# Patient Record
Sex: Female | Born: 1995 | Hispanic: Yes | Marital: Single | Smoking: Never smoker
Health system: Southern US, Community
[De-identification: ages and names within clinical notes are randomized; demographics above are authoritative.]

## PROBLEM LIST (undated history)

## (undated) DIAGNOSIS — Z789 Other specified health status: Secondary | ICD-10-CM

## (undated) DIAGNOSIS — O24419 Gestational diabetes mellitus in pregnancy, unspecified control: Secondary | ICD-10-CM

## (undated) DIAGNOSIS — H9191 Unspecified hearing loss, right ear: Secondary | ICD-10-CM

## (undated) HISTORY — PX: NO PAST SURGERIES: SHX2092

## (undated) HISTORY — DX: Unspecified hearing loss, right ear: H91.91

---
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
## 2017-06-14 ENCOUNTER — Emergency Department: Payer: Self-pay

## 2017-06-14 ENCOUNTER — Emergency Department
Admission: EM | Admit: 2017-06-14 | Discharge: 2017-06-14 | Disposition: A | Discharge status: Home / Self Care | Payer: Self-pay | Attending: Emergency Medicine | Admitting: Emergency Medicine

## 2017-06-14 DIAGNOSIS — Y92410 Unspecified street and highway as the place of occurrence of the external cause: Secondary | ICD-10-CM | POA: Insufficient documentation

## 2017-06-14 DIAGNOSIS — S161XXA Strain of muscle, fascia and tendon at neck level, initial encounter: Secondary | ICD-10-CM | POA: Insufficient documentation

## 2017-06-14 DIAGNOSIS — S39012A Strain of muscle, fascia and tendon of lower back, initial encounter: Secondary | ICD-10-CM | POA: Insufficient documentation

## 2017-06-14 MED ORDER — CYCLOBENZAPRINE HCL 10 MG PO TABS
10.0000 mg | ORAL_TABLET | Freq: Three times a day (TID) | ORAL | 0 refills | Status: AC | PRN
Start: 2017-06-14 — End: 2017-07-05

## 2017-06-14 MED ORDER — KETOROLAC TROMETHAMINE 30 MG/ML IJ SOLN
30.0000 mg | Freq: Once | INTRAMUSCULAR | Status: AC
Start: 2017-06-14 — End: 2017-06-14
  Administered 2017-06-14: 30 mg via INTRAMUSCULAR
  Filled 2017-06-14: qty 1

## 2017-06-14 MED ORDER — IBUPROFEN 600 MG PO TABS
600.0000 mg | ORAL_TABLET | Freq: Four times a day (QID) | ORAL | 0 refills | Status: DC | PRN
Start: 2017-06-14 — End: 2020-02-16

## 2017-06-14 MED ORDER — CYCLOBENZAPRINE HCL 10 MG PO TABS
10.0000 mg | ORAL_TABLET | Freq: Once | ORAL | Status: AC
Start: 2017-06-14 — End: 2017-06-14
  Administered 2017-06-14: 10 mg via ORAL
  Filled 2017-06-14: qty 1
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
## 2017-06-14 NOTE — ED Notes (Signed)
Bed: N 38  Expected date:   Expected time:   Means of arrival:   Comments:  M 426
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
## 2018-01-07 ENCOUNTER — Ambulatory Visit: Payer: 59 | Attending: Emergency Medicine

## 2018-01-07 VITALS — BP 115/68 | HR 76 | Temp 97.7°F | Resp 16 | Ht 63.0 in | Wt 158.0 lb

## 2018-01-07 DIAGNOSIS — Z87828 Personal history of other (healed) physical injury and trauma: Secondary | ICD-10-CM | POA: Insufficient documentation
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
## 2020-02-16 ENCOUNTER — Emergency Department
Admission: EM | Admit: 2020-02-16 | Discharge: 2020-02-16 | Disposition: A | Discharge status: Home / Self Care | Payer: No Typology Code available for payment source | Attending: Emergency Medicine | Admitting: Emergency Medicine

## 2020-02-16 ENCOUNTER — Emergency Department: Payer: No Typology Code available for payment source

## 2020-02-16 DIAGNOSIS — R519 Headache, unspecified: Secondary | ICD-10-CM | POA: Insufficient documentation

## 2020-02-16 DIAGNOSIS — H9311 Tinnitus, right ear: Secondary | ICD-10-CM | POA: Insufficient documentation

## 2020-02-16 DIAGNOSIS — H938X1 Other specified disorders of right ear: Secondary | ICD-10-CM | POA: Insufficient documentation

## 2020-02-16 DIAGNOSIS — R42 Dizziness and giddiness: Secondary | ICD-10-CM | POA: Insufficient documentation

## 2020-02-16 DIAGNOSIS — H9201 Otalgia, right ear: Secondary | ICD-10-CM | POA: Insufficient documentation

## 2020-02-16 DIAGNOSIS — H9191 Unspecified hearing loss, right ear: Secondary | ICD-10-CM | POA: Insufficient documentation

## 2020-02-16 MED ORDER — IBUPROFEN 400 MG PO TABS
800.0000 mg | ORAL_TABLET | Freq: Once | ORAL | Status: AC
Start: 2020-02-16 — End: 2020-02-16
  Administered 2020-02-16: 800 mg via ORAL
  Filled 2020-02-16: qty 2

## 2020-02-16 MED ORDER — MECLIZINE HCL 25 MG PO TABS
25.0000 mg | ORAL_TABLET | Freq: Three times a day (TID) | ORAL | 0 refills | Status: AC | PRN
Start: 2020-02-16 — End: ?

## 2020-02-16 MED ORDER — MECLIZINE HCL 12.5 MG PO TABS
25.0000 mg | ORAL_TABLET | Freq: Once | ORAL | Status: AC
Start: 2020-02-16 — End: 2020-02-16
  Administered 2020-02-16: 25 mg via ORAL
  Filled 2020-02-16: qty 2

## 2020-02-16 MED ORDER — IBUPROFEN 600 MG PO TABS
600.0000 mg | ORAL_TABLET | Freq: Four times a day (QID) | ORAL | 0 refills | Status: AC | PRN
Start: 2020-02-16 — End: ?

## 2020-02-16 NOTE — ED Triage Notes (Signed)
Pt c/o 8/10 HA in temples with intermittent periods of lightheadedness/dizziness. Pt ambulatory and denies n/v/d, photophobia , SOB or chest pain.

## 2020-02-16 NOTE — ED Provider Notes (Signed)
[facility] EMERGENCY DEPARTMENT APP H&P         CLINICAL SUMMARY          Diagnosis:    .     Final diagnoses:   Acute nonintractable headache, unspecified headache type   Right ear pain   Dizziness         MDM Notes:       24 year old female with 3 days of right ear pain, fullness, decreased hearing, tinnitus, headache and dizziness.  In the ED she is very well-appearing with stable vitals.  No focal neuro deficits.  CT head is negative for acute process.  Symptoms possibly due to Mnire's disease, or other similar syndrome.  Advised outpatient follow-up with ENT.  Return to ED for any worsening symptoms or concerns.    Discussed w/ ED attending     Disposition:         Discharge           I was present for the bedside discharge alongside the patient's ED nurse. Course of visit in the ED and discharge instructions were reviewed with patient and they were given the opportunity to ask any questions regarding their care today. Patient and/ or patient's family verbalized understanding of, and comfort with, instructions and plan.          Discharge Prescriptions     Medication Sig Dispense Auth. Provider    ibuprofen (ADVIL) 600 MG tablet Take 1 tablet (600 mg total) by mouth every 6 (six) hours as needed for Pain or Fever 20 tablet Avarie Tavano, Ardeth Perfect, PA    meclizine (ANTIVERT) 25 MG tablet Take 1 tablet (25 mg total) by mouth 3 (three) times daily as needed for Dizziness 10 tablet Honor Junes, PA                     CLINICAL INFORMATION        HPI:      Chief Complaint: Headache and Otalgia  .      Context: home  Location: R ear, head  Duration: 3 days  Quality: aching  Timing: persistent  Maximum Severity: moderate  Modifying Factors: see additional history    Erica Sims is a 24 y.o. female  has no past medical history on file. who presents with 3 days of right ear pain associated with fullness, decreased hearing, ringing in her ear, headache and dizziness.  She saw her PCP at  onset of symptoms and was told her ear looked fine and discharged home.  She was advised to follow-up in the ED if her symptoms persisted.  She has had persistent symptoms over the last 3 days.  No nausea vomiting, loss of consciousness, fevers or chills.  No other symptoms or complaints.    History obtained from: Patient          ROS:      Review of Systems   Constitutional: Negative for chills and fever.   HENT: Positive for ear pain, hearing loss and tinnitus.    Eyes: Negative for blurred vision and double vision.   Respiratory: Negative for cough.    Cardiovascular: Negative for chest pain.   Gastrointestinal: Negative for nausea and vomiting.   Genitourinary: Negative for dysuria.   Musculoskeletal: Negative for myalgias.   Skin: Negative for rash.   Neurological: Positive for headaches.   Endo/Heme/Allergies: Does not bruise/bleed easily.   Psychiatric/Behavioral: Negative for depression.       All other systems reviewed and  negative except for what is documented above.      Physical Exam:      Pulse 99   BP 119/75   Resp 20   SpO2 99 %   Temp 97.9 F (36.6 C)    Physical Exam  Vitals and nursing note reviewed.   Constitutional:       General: She is not in acute distress.     Appearance: She is well-developed. She is not diaphoretic.   HENT:      Head: Normocephalic and atraumatic.      Right Ear: Tympanic membrane, ear canal and external ear normal.      Left Ear: Tympanic membrane, ear canal and external ear normal.   Eyes:      Conjunctiva/sclera: Conjunctivae normal.      Pupils: Pupils are equal, round, and reactive to light.   Cardiovascular:      Rate and Rhythm: Normal rate and regular rhythm.      Heart sounds: Normal heart sounds.   Pulmonary:      Effort: Pulmonary effort is normal. No respiratory distress.      Breath sounds: Normal breath sounds.   Abdominal:      General: Bowel sounds are normal.      Palpations: Abdomen is soft.      Tenderness: There is no abdominal tenderness.    Lymphadenopathy:      Cervical: No cervical adenopathy.   Skin:     General: Skin is warm and dry.   Neurological:      General: No focal deficit present.      Mental Status: She is alert and oriented to person, place, and time.                 PAST HISTORY        Primary Care Provider: Lorin Picket, MD        PMH/PSH:    .     History reviewed. No pertinent past medical history.    She has a past surgical history that includes No past surgeries.      Social/Family History:      She reports that she has never smoked. She has never used smokeless tobacco. She reports that she does not drink alcohol and does not use drugs.    No family history on file.      Listed Medications on Arrival:    .     Discharge Medication List as of 02/16/2020 12:53 PM         Allergies: She has No Known Allergies.            VISIT INFORMATION        Clinical Course in the ED:      Counseling: I have spoke with the patient/family and discussed today's findings, in addition to providing specific details for the plan of care. Questions are answered and there is agreement with the plan. Return precautions discussed.           Medications Given in the ED:    .     ED Medication Orders (From admission, onward)    Start Ordered     Status Ordering Provider    02/16/20 1145 02/16/20 1144  ibuprofen (ADVIL) tablet 800 mg  Once     Route: Oral  Ordered Dose: 800 mg     Last MAR action: Given Klara Stjames MICHELLE    02/16/20 1145 02/16/20 1144  meclizine (ANTIVERT) tablet 25 mg  Once  Route: Oral  Ordered Dose: 25 mg     Last MAR action: Given Jin Capote MICHELLE            Procedures:      Procedures      Interpretations:      Differential Diagnosis (not completely inclusive): High risk differentials considered, and include otitis externa, otitis media, bullous myringitis, mastoiditis, TM perforation    EKG:           Rhythm Strip Interpretation / Cardiac Monitor Analysis:           Pulse Ox Analysis: saturation: 99 %; Oxygen use:  room air; Interpretation: Normal      Critical Care Time:     Radiology -  interpreted by me with the following observations: CT head normal            RESULTS        Lab Results:      Results     ** No results found for the last 24 hours. **              Radiology Results:      CT Head without Contrast   Final Result    Normal examination.      Terrilee Croak, MD    02/16/2020 12:44 PM                  Scribe Attestation:                Honor Junes, PA  02/16/20 1335

## 2020-02-16 NOTE — Discharge Instructions (Signed)
Dear Ms. Erica Sims:    Thank you for choosing the [facility] Emergency Department, the premier emergency department in the [place] area.  I hope your visit today was EXCELLENT. You will receive a survey via text message that will give you the opportunity to provide feedback to your team about your visit. Please do not hesitate to reach out with any questions!    Specific instructions for your visit today:    FOLLOW UP WITH ENT  .    IF YOU DO NOT CONTINUE TO IMPROVE OR YOUR CONDITION WORSENS, PLEASE CONTACT YOUR DOCTOR OR RETURN IMMEDIATELY TO THE EMERGENCY DEPARTMENT.    Sincerely,  Shara Blazing, MD  Attending Emergency Physician  [facility] Emergency Department    ONSITE PHARMACY  Our full service onsite pharmacy is located in the ER waiting room.  Open 7 days a week from 9 am to 9 pm.  We accept all major insurances and prices are competitive with major retailers.  Ask your provider to print your prescriptions down to the pharmacy to speed you on your way home.    OBTAINING A PRIMARY CARE APPOINTMENT    Primary care physicians (PCPs, also known as primary care doctors) are either internists or family medicine doctors. Both types of PCPs focus on health promotion, disease prevention, patient education and counseling, and treatment of acute and chronic medical conditions.    If you need a primary care doctor, please call the below number and ask who is receiving new patients.     [facility]  Telephone:  [phone]  [email or web address]    DOCTOR REFERRALS  Call [phone] (available 24 hours a day, 7 days a week) if you need any further referrals and we can help you find a primary care doctor or specialist.  Also, available online at:  [email or web address]    YOUR CONTACT INFORMATION  Before leaving please check with registration to make sure we have an up-to-date contact number.  You can call registration at [phone] to update your information.  For  questions about your hospital bill, please call [phone].  For questions about your Emergency Dept Physician bill please call [phone].      FREE HEALTH SERVICES  If you need help with health or social services, please call 2-1-1 for a free referral to resources in your area.  2-1-1 is a free service connecting people with information on health insurance, free clinics, pregnancy, mental health, dental care, food assistance, housing, and substance abuse counseling.  Also, available online at:  http://[email or web address].org    MEDICAL RECORDS AND TESTS  Certain laboratory test results do not come back the same day, for example urine cultures.   We will contact you if other important findings are noted.  Radiology films are often reviewed again to ensure accuracy.  If there is any discrepancy, we will notify you.      Please call [phone] to pick up a complimentary CD of any radiology studies performed.  If you or your doctor would like to request a copy of your medical records, please call [phone].      ORTHOPEDIC INJURY   Please know that significant injuries can exist even when an initial x-ray is read as normal or negative.  This can occur because some fractures (broken bones) are not initially visible on x-rays.  For this reason, close outpatient follow-up with your primary care doctor or bone specialist (orthopedist) is required.    MEDICATIONS AND FOLLOWUP  Please be aware  that some prescription medications can cause drowsiness.  Use caution when driving or operating machinery.    The examination and treatment you have received in our Emergency Department is provided on an emergency basis, and is not intended to be a substitute for your primary care physician.  It is important that your doctor checks you again and that you report any new or remaining problems at that time.      [place]  The nearest 24 hour pharmacy is:    [facility] [place],  [place] [address]  [place]  [facility])  Call to start or finish an application, compare plans, enroll or ask a question.  [place]: [phone]  Web:  Healthcare.gov    Help Enrolling in [place]  ([phone])499-5374 (TOLL-FREE)  929 670 4512 (TTY)  Web:  Http://www.coverva.org    Local Help Enrolling in the [place]  385-333-4211 (MAIN)  Email:  health-help@nvfs$ .org  Web:  http://lewis-perez.info/  Address:  [id number] [place], [place] [address]    SEDATING MEDICATIONS  Sedating medications include strong pain medications (e.g. narcotics), muscle relaxers, benzodiazepines (used for anxiety and as muscle relaxers), Benadryl/diphenhydramine and other antihistamines for allergic reactions/itching, and other medications.  If you are unsure if you have received a sedating medication, please ask your physician or nurse.  If you received a sedating medication: DO NOT drive a car. DO NOT operate machinery. DO NOT perform jobs where you need to be alert.  DO NOT drink alcoholic beverages while taking this medicine.     If you get dizzy, sit or lie down at the first signs. Be careful going up and down stairs.  Be extra careful to prevent falls.     Never give this medicine to others.     Keep this medicine out of reach of children.     Do not take or save old medicines. Throw them away when outdated.     Keep all medicines in a cool, dry place. DO NOT keep them in your bathroom medicine cabinet or in a cabinet above the stove.    MEDICATION REFILLS  Please be aware that we cannot refill any prescriptions through the ER. If you need further treatment from what is provided at your ER visit, please follow up with your primary care doctor or your pain management specialist.    [place]  Did you know [facility] has two freestanding ERs located just a few miles away?   [place] [facility] [place] [facility] [place] have short wait times, easy free parking directly in front of the building and top patient satisfaction scores - and the same Board Certified Emergency Medicine doctors as [facility].

## 2020-02-16 NOTE — ED Provider Notes (Signed)
ATTENDING NOTE    I saw and evaluated the patient. Discussed with resident/ midlevel and agree with the resident's/ midlevel's findings and plan as documented in the resident's note.    CC: Erica Sims is a 24 y.o. female c/o 3 days of right ear pain with fullness ringing and dizziness..  Patient was advised to come to the.  Exam: As per PA  Lab: NA  Studies: CT of the head negative  Plan: CT negative discharge home follow-up with ENT  1. Acute nonintractable headache, unspecified headache type    2. Right ear pain    3. Dizziness            Petra Kuba, MD  02/16/20 1525

## 2020-06-19 NOTE — L&D Delivery Note (Signed)
OB/GYN Faculty Practice Delivery Note  Cheryl Whitney is a 25 y.o. G1P0 s/p VD at [redacted]w[redacted]d. She was admitted for IOL due to pre-eclampsia with severe features.   ROM: 27h 61m with clear fluid GBS Status:  Negative/-- (11/10 1005) Maximum Maternal Temperature: 103.67F right after delivery, afebrile prior to however with some suspicion that she may been developing Triple I. No antibiotics were given prior to delivery.  Labor Progress: Initial SVE: 0/thick/posterior. She had a protracted labor course that initially started on 11/14, she slowly progressed to complete with assistance of cytotec, FB placement, AROM, and pit. She had intermittently been on pit since 11/14.    Delivery Date/Time: 11/16 at 2116  Delivery: Called to room and patient was complete and pushing. Head delivered LOA. Loose nuchal cord present x1 and delivered through. Shoulder and body delivered in usual fashion. Infant initially stunned, placed on mother's abdomen, dried and stimulated. Cord clamped x 2 shortly after delivery by provider and given to awaiting NICU team for evaluation. Cord ABG collected, 7.146. Cord blood drawn. Cord blood drawn. Placenta delivered spontaneously with gentle cord traction. Brisk vaginal bleeding noted shortly after placental delivery, given TXA and Hemabate/lomotil in addition to pitocin started earlier with fundal massage. Manual lower uterine sweep performed with a boggy lower segment, moderate amount of clots retrieved. She had continued oozing, thus methergine IM was given. BP immediately prior to administration was 120/57. I&O performed with ~ 75cc clear urine. Bleeding significant improved with measures above and laceration repair as below, fundus remained firm. One additional lower sweep performed with minimal bleeding. Labia, perineum, vagina, and cervix inspected with a 1st degree perineal laceration that was repaired with 3.0 vicryl on CT.   Baby Weight: 3350g  Placenta: 3 vessel,  intact. Sent to pathology.  Complications: PPH  Lacerations: 1st degree perineal  EBL: 1200 mL Analgesia: Epidural   Infant:  APGAR (1 MIN): 6   APGAR (5 MINS): 8    Leticia Penna, DO  OB Family Medicine Fellow, [facility] for [employer], [facility] 05/04/2021, 10:14 PM

## 2020-10-02 ENCOUNTER — Inpatient Hospital Stay (HOSPITAL_COMMUNITY)
Admission: AD | Admit: 2020-10-02 | Discharge: 2020-10-02 | Disposition: A | Discharge status: Home / Self Care | Payer: Medicaid - Out of State | Attending: Obstetrics & Gynecology | Admitting: Obstetrics & Gynecology

## 2020-10-02 ENCOUNTER — Encounter (HOSPITAL_COMMUNITY): Payer: Self-pay | Admitting: Obstetrics & Gynecology

## 2020-10-02 ENCOUNTER — Other Ambulatory Visit: Payer: Self-pay

## 2020-10-02 ENCOUNTER — Inpatient Hospital Stay (HOSPITAL_COMMUNITY): Payer: Medicaid - Out of State

## 2020-10-02 DIAGNOSIS — Z3490 Encounter for supervision of normal pregnancy, unspecified, unspecified trimester: Secondary | ICD-10-CM

## 2020-10-02 DIAGNOSIS — O200 Threatened abortion: Secondary | ICD-10-CM | POA: Diagnosis not present

## 2020-10-02 DIAGNOSIS — Z3A01 Less than 8 weeks gestation of pregnancy: Secondary | ICD-10-CM | POA: Insufficient documentation

## 2020-10-02 DIAGNOSIS — O209 Hemorrhage in early pregnancy, unspecified: Secondary | ICD-10-CM

## 2020-10-02 DIAGNOSIS — O418X10 Other specified disorders of amniotic fluid and membranes, first trimester, not applicable or unspecified: Secondary | ICD-10-CM

## 2020-10-02 HISTORY — DX: Other specified health status: Z78.9

## 2020-10-02 LAB — CBC
HCT: 39 % (ref 36.0–46.0)
Hemoglobin: 13.1 g/dL (ref 12.0–15.0)
MCH: 30 pg (ref 26.0–34.0)
MCHC: 33.6 g/dL (ref 30.0–36.0)
MCV: 89.4 fL (ref 80.0–100.0)
Platelets: 250 10*3/uL (ref 150–400)
RBC: 4.36 MIL/uL (ref 3.87–5.11)
RDW: 12.5 % (ref 11.5–15.5)
WBC: 8.3 10*3/uL (ref 4.0–10.5)
nRBC: 0 % (ref 0.0–0.2)

## 2020-10-02 LAB — HCG, QUANTITATIVE, PREGNANCY: hCG, Beta Chain, Quant, S: 51289 m[IU]/mL — ABNORMAL HIGH (ref <5)

## 2020-10-02 LAB — POCT PREGNANCY, URINE: Preg Test, Ur: POSITIVE — AB

## 2020-10-02 LAB — URINALYSIS, ROUTINE W REFLEX MICROSCOPIC
Bilirubin Urine: NEGATIVE
Glucose, UA: NEGATIVE mg/dL
Ketones, ur: NEGATIVE mg/dL
Leukocytes,Ua: NEGATIVE
Nitrite: NEGATIVE
Protein, ur: 100 mg/dL — AB
Specific Gravity, Urine: 1.023 (ref 1.005–1.030)
pH: 6 (ref 5.0–8.0)

## 2020-10-02 LAB — OB RESULTS CONSOLE HIV ANTIBODY (ROUTINE TESTING): HIV: NONREACTIVE

## 2020-10-02 LAB — WET PREP, GENITAL
Sperm: NONE SEEN
Trich, Wet Prep: NONE SEEN
Yeast Wet Prep HPF POC: NONE SEEN

## 2020-10-02 LAB — ABO/RH: ABO/RH(D): O POS

## 2020-10-02 LAB — HIV ANTIBODY (ROUTINE TESTING W REFLEX): HIV Screen 4th Generation wRfx: NONREACTIVE

## 2020-10-02 NOTE — Discharge Instructions (Signed)
Return to MAU:  If you have heavier bleeding that soaks through more that 2 pads per hour for an hour or more  If you bleed so much that you feel like you might pass out or you do pass out  If you have significant abdominal pain that is not improved with Tylenol 1000 mg every 6 hours as needed for pain  If you develop a fever > 100.5   

## 2020-10-02 NOTE — MAU Note (Signed)
Pt reports to mau with c/o vag bleeding.  Pt states she noticed light spotting on Monday but woke up to heavier bright red bleeding today.  Pt states she is having to wear a pad but has not saturated one completely.  Pt denies any pain at this time.

## 2020-10-02 NOTE — MAU Provider Note (Signed)
History     CSN: [id number]  Arrival date and time: 10/02/20 0851   Event Date/Time   First Provider Initiated Contact with Patient 10/02/20 1000      Chief Complaint  Patient presents with  . Vaginal Bleeding   Ms. Cheryl Whitney is a 25 y.o. year old G1P0 female at [redacted]w[redacted]d weeks gestation who presents to MAU reporting she noticed light spotting Monday 09/27/2020. She reports she started having BRB this morning. She has been wearing a pad, but not saturating it. She denies any pain. Her boyfriend/FOB is present and contributing to the history taking.   OB History    Gravida  1   Para      Term      Preterm      AB      Living        SAB      IAB      Ectopic      Multiple      Live Births              Past Medical History:  Diagnosis Date  . Medical history non-contributory     Past Surgical History:  Procedure Laterality Date  . NO PAST SURGERIES      History reviewed. No pertinent family history.  Social History   Tobacco Use  . Smoking status: Never Smoker  . Smokeless tobacco: Never Used  Substance Use Topics  . Alcohol use: Not Currently  . Drug use: Never    Allergies: No Known Allergies  No medications prior to admission.    Review of Systems  Constitutional: Negative.   HENT: Negative.   Eyes: Negative.   Respiratory: Negative.   Cardiovascular: Negative.   Gastrointestinal: Negative.   Endocrine: Negative.   Genitourinary: Positive for vaginal bleeding (light spotting that increased to BRB). Negative for pelvic pain.  Musculoskeletal: Negative.   Skin: Negative.   Allergic/Immunologic: Negative.   Neurological: Negative.   Hematological: Negative.   Psychiatric/Behavioral: Negative.    Physical Exam   Blood pressure 116/71, pulse 80, temperature 97.9 F (36.6 C), temperature source Oral, resp. rate 16, height 5\' 3"  (1.6 m), last menstrual period 08/10/2020, SpO2 100 %.  Physical Exam Vitals and nursing  note reviewed. Exam conducted with a chaperone present.  Constitutional:      Appearance: Normal appearance. She is normal weight.  Cardiovascular:     Rate and Rhythm: Normal rate.     Pulses: Normal pulses.  Pulmonary:     Effort: Pulmonary effort is normal.  Abdominal:     Palpations: Abdomen is soft.  Genitourinary:    General: Normal vulva.     Comments: Pelvic exam: External genitalia normal, SE: vaginal walls pink and well rugated, cervix is smooth, pink, no lesions, scant amt of light red blood in vaginal vault -- WP, GC/CT done, cervix visually closed, Uterus is non-tender, no CMT or friability, no adnexal tenderness.  Musculoskeletal:        General: Normal range of motion.  Skin:    General: Skin is warm and dry.  Neurological:     Mental Status: She is alert and oriented to person, place, and time.  Psychiatric:        Mood and Affect: Mood normal.        Behavior: Behavior normal.        Thought Content: Thought content normal.        Judgment: Judgment normal.     MAU Course  Procedures  MDM CCUA UPT CBC ABO/Rh HCG Wet Prep GC/CT -- pending HIV -- pending OB < 14 wks [place] with TV  Results for orders placed or performed during the hospital encounter of 10/02/20 (from the past 24 hour(s))  Pregnancy, urine POC     Status: Abnormal   Collection Time: 10/02/20  8:59 AM  Result Value Ref Range   Preg Test, Ur POSITIVE (A) NEGATIVE  Urinalysis, Routine w reflex microscopic Urine, Clean Catch     Status: Abnormal   Collection Time: 10/02/20  8:59 AM  Result Value Ref Range   Color, Urine YELLOW YELLOW   APPearance HAZY (A) CLEAR   Specific Gravity, Urine 1.023 1.005 - 1.030   pH 6.0 5.0 - 8.0   Glucose, UA NEGATIVE NEGATIVE mg/dL   Hgb urine dipstick LARGE (A) NEGATIVE   Bilirubin Urine NEGATIVE NEGATIVE   Ketones, ur NEGATIVE NEGATIVE mg/dL   Protein, ur 841 (A) NEGATIVE mg/dL   Nitrite NEGATIVE NEGATIVE   Leukocytes,Ua NEGATIVE NEGATIVE   RBC / HPF  0-5 0 - 5 RBC/hpf   WBC, UA 6-10 0 - 5 WBC/hpf   Bacteria, UA RARE (A) NONE SEEN   Squamous Epithelial / LPF 11-20 0 - 5   Mucus PRESENT   HIV Antibody (routine testing w rflx)     Status: None   Collection Time: 10/02/20 10:02 AM  Result Value Ref Range   HIV Screen 4th Generation wRfx Non Reactive Non Reactive  CBC     Status: None   Collection Time: 10/02/20 10:02 AM  Result Value Ref Range   WBC 8.3 4.0 - 10.5 K/uL   RBC 4.36 3.87 - 5.11 MIL/uL   Hemoglobin 13.1 12.0 - 15.0 g/dL   HCT 66.0 63.0 - 16.0 %   MCV 89.4 80.0 - 100.0 fL   MCH 30.0 26.0 - 34.0 pg   MCHC 33.6 30.0 - 36.0 g/dL   RDW 10.9 32.3 - 55.7 %   Platelets 250 150 - 400 K/uL   nRBC 0.0 0.0 - 0.2 %  ABO/Rh     Status: None   Collection Time: 10/02/20 10:02 AM  Result Value Ref Range   ABO/RH(D) O POS    No rh immune globuloin      NOT A RH IMMUNE GLOBULIN CANDIDATE, PT RH POSITIVE Performed at [facility], [address], [place], [place] [address]   hCG, quantitative, pregnancy     Status: Abnormal   Collection Time: 10/02/20 10:02 AM  Result Value Ref Range   hCG, Beta Chain, Quant, S 51,289 (H) <5 mIU/mL  Wet prep, genital     Status: Abnormal   Collection Time: 10/02/20 10:09 AM  Result Value Ref Range   Yeast Wet Prep HPF POC NONE SEEN NONE SEEN   Trich, Wet Prep NONE SEEN NONE SEEN   Clue Cells Wet Prep HPF POC PRESENT (A) NONE SEEN   WBC, Wet Prep HPF POC FEW (A) NONE SEEN   Sperm NONE SEEN     US OB LESS THAN 14 WEEKS WITH OB TRANSVAGINAL  Result Date: 10/02/2020 CLINICAL DATA:  Pregnant, vaginal bleeding. EXAM: OBSTETRIC <14 WK [place] AND TRANSVAGINAL OB US DOPPLER ULTRASOUND OF OVARIES TECHNIQUE: Both transabdominal and transvaginal ultrasound examinations were performed for complete evaluation of the gestation as well as the maternal uterus, adnexal regions, and pelvic cul-de-sac. Transvaginal technique was performed to assess early pregnancy. Color and duplex Doppler ultrasound was  utilized to evaluate blood flow to the  ovaries. COMPARISON:  None. FINDINGS: Intrauterine gestational sac: Single Yolk sac:  Visualized. Embryo:  Visualized. Cardiac Activity: Visualized. Heart Rate: 130 bpm MSD:   mm    w     d CRL:   5.9 mm   6 w 2 d                  [place] [facility]: May 26, 2021 Subchorionic hemorrhage: Small subchorionic hemorrhage is identified. Maternal uterus/adnexae: The bilateral ovaries are normal. No free fluid is identified. IMPRESSION: Normal single intrauterine pregnancy measuring to 6 weeks and 2 days by crown-rump length. Small subchorionic hemorrhage is noted. Electronically Signed   By: Sherian Rein M.D.   On: 10/02/2020 11:14    Assessment and Plan  Subchorionic hematoma in first trimester, single or unspecified fetus - Information provided on [facility] - Explained that [facility] places her at higher risk for miscarriage - Return to MAU:  If you have heavier bleeding that soaks through more that 2 pads per hour for an hour or more  If you bleed so much that you feel like you might pass out or you do pass out  If you have significant abdominal pain that is not improved with Tylenol 1000 mg every 6 hours as needed for pain  If you develop a fever > 100.5    [redacted] weeks gestation of pregnancy  Threatened miscarriage in early pregnancy - Information provided on threatened miscarriage   - Discharge home - Start [facility] as soon as possible Patient verbalized an understanding of the plan of care and agrees.     Raelyn Mora, CNM 10/02/2020, 10:08 AM

## 2020-10-04 LAB — GC/CHLAMYDIA PROBE AMP (~~LOC~~) NOT AT ARMC
Chlamydia: NEGATIVE
Comment: NEGATIVE
Comment: NORMAL
Neisseria Gonorrhea: NEGATIVE

## 2020-10-25 ENCOUNTER — Other Ambulatory Visit: Payer: Self-pay | Admitting: Family

## 2020-10-25 DIAGNOSIS — Z3682 Encounter for antenatal screening for nuchal translucency: Secondary | ICD-10-CM

## 2020-10-25 DIAGNOSIS — Z3A13 13 weeks gestation of pregnancy: Secondary | ICD-10-CM

## 2020-10-25 LAB — OB RESULTS CONSOLE RUBELLA ANTIBODY, IGM
Rubella: IMMUNE
Rubella: IMMUNE

## 2020-10-25 LAB — OB RESULTS CONSOLE GC/CHLAMYDIA
Chlamydia: NEGATIVE
Gonorrhea: NEGATIVE

## 2020-10-25 LAB — HEPATITIS C ANTIBODY: HCV Ab: NEGATIVE

## 2020-10-25 LAB — OB RESULTS CONSOLE ABO/RH: RH Type: POSITIVE

## 2020-10-25 LAB — CYTOLOGY - PAP: Pap: NEGATIVE

## 2020-10-25 LAB — OB RESULTS CONSOLE HEPATITIS B SURFACE ANTIGEN: Hepatitis B Surface Ag: NEGATIVE

## 2020-10-25 LAB — OB RESULTS CONSOLE ANTIBODY SCREEN: Antibody Screen: NEGATIVE

## 2020-10-25 LAB — OB RESULTS CONSOLE VARICELLA ZOSTER ANTIBODY, IGG: Varicella: IMMUNE

## 2020-10-25 LAB — CYSTIC FIBROSIS DIAGNOSTIC STUDY: Interpretation-CFDNA:: NEGATIVE

## 2020-11-22 ENCOUNTER — Inpatient Hospital Stay (HOSPITAL_COMMUNITY)
Admission: AD | Admit: 2020-11-22 | Discharge: 2020-11-22 | Disposition: A | Discharge status: Home / Self Care | Payer: Medicaid - Out of State | Attending: Family Medicine | Admitting: Family Medicine

## 2020-11-22 ENCOUNTER — Other Ambulatory Visit: Payer: Self-pay

## 2020-11-22 ENCOUNTER — Encounter (HOSPITAL_COMMUNITY): Payer: Self-pay | Admitting: Family Medicine

## 2020-11-22 DIAGNOSIS — W010XXA Fall on same level from slipping, tripping and stumbling without subsequent striking against object, initial encounter: Secondary | ICD-10-CM | POA: Diagnosis not present

## 2020-11-22 DIAGNOSIS — S29011A Strain of muscle and tendon of front wall of thorax, initial encounter: Secondary | ICD-10-CM | POA: Diagnosis not present

## 2020-11-22 DIAGNOSIS — H905 Unspecified sensorineural hearing loss: Secondary | ICD-10-CM | POA: Insufficient documentation

## 2020-11-22 DIAGNOSIS — O9A211 Injury, poisoning and certain other consequences of external causes complicating pregnancy, first trimester: Secondary | ICD-10-CM | POA: Diagnosis not present

## 2020-11-22 DIAGNOSIS — O99891 Other specified diseases and conditions complicating pregnancy: Secondary | ICD-10-CM | POA: Diagnosis not present

## 2020-11-22 DIAGNOSIS — W01198A Fall on same level from slipping, tripping and stumbling with subsequent striking against other object, initial encounter: Secondary | ICD-10-CM | POA: Diagnosis not present

## 2020-11-22 DIAGNOSIS — O26891 Other specified pregnancy related conditions, first trimester: Secondary | ICD-10-CM | POA: Diagnosis not present

## 2020-11-22 DIAGNOSIS — Z3A13 13 weeks gestation of pregnancy: Secondary | ICD-10-CM | POA: Diagnosis not present

## 2020-11-22 DIAGNOSIS — M549 Dorsalgia, unspecified: Secondary | ICD-10-CM | POA: Diagnosis present

## 2020-11-22 DIAGNOSIS — Y9389 Activity, other specified: Secondary | ICD-10-CM | POA: Insufficient documentation

## 2020-11-22 DIAGNOSIS — Y9289 Other specified places as the place of occurrence of the external cause: Secondary | ICD-10-CM | POA: Diagnosis not present

## 2020-11-22 DIAGNOSIS — S29012A Strain of muscle and tendon of back wall of thorax, initial encounter: Secondary | ICD-10-CM | POA: Diagnosis not present

## 2020-11-22 LAB — URINALYSIS, ROUTINE W REFLEX MICROSCOPIC
Bacteria, UA: NONE SEEN
Bilirubin Urine: NEGATIVE
Glucose, UA: NEGATIVE mg/dL
Hgb urine dipstick: NEGATIVE
Ketones, ur: NEGATIVE mg/dL
Nitrite: NEGATIVE
Protein, ur: NEGATIVE mg/dL
Specific Gravity, Urine: 1.02 (ref 1.005–1.030)
pH: 5 (ref 5.0–8.0)

## 2020-11-22 MED ORDER — ACETAMINOPHEN 500 MG PO TABS: 1000.0000 mg | ORAL_TABLET | Freq: Three times a day (TID) | ORAL | 2 refills | Status: AC | PRN

## 2020-11-22 NOTE — MAU Note (Signed)
Pt reports working yesterday after falling and the discomfort in her back has increased.

## 2020-11-22 NOTE — MAU Note (Signed)
Presents c/o back pain after a fall that occurred yesterday.  States fell landing onto back.  Denies VB.  Hasn't taken any meds or used heat to manage pain.

## 2020-11-22 NOTE — Discharge Instructions (Signed)
Intercostal Strain  What are the causes? This condition is usually caused by a hard, direct hit to an area of the body. This often occurs while playing contact sports. What are the signs or symptoms? Symptoms of this condition include:  Swelling and redness of the injured area.  Discoloration of the injured area.  Tenderness and soreness of the injured area.  Pain with or without movement.  Pain when breathing in. How is this diagnosed? This condition may be diagnosed based on:  Your symptoms and medical history.  A physical exam.  How is this treated? This condition may be treated with:  Rest. This is often the best treatment for a rib contusion.  Ice packs. This reduces swelling and inflammation.  Deep-breathing exercises. These may be recommended to reduce the risk for lung collapse and pneumonia.  Medicines. Over-the-counter or prescription medicines may be given to control pain.  Injection of a numbing medicine around the nerve near your injury (nerve block). Follow these instructions at home: Medicines  Take over-the-counter and prescription medicines only as told by your health care provider.  Ask your health care provider if the medicine prescribed to you: ? Requires you to avoid driving or using machinery. ? Can cause constipation. You may need to take these actions to prevent or treat constipation:  Drink enough fluid to keep your urine pale yellow.  Take over-the-counter or prescription medicines.  Eat foods that are high in fiber, such as beans, whole grains, and fresh fruits and vegetables.  Limit foods that are high in fat and processed sugars, such as fried or sweet foods. Managing pain, stiffness, and swelling If directed, put ice on the injured area. To do this:  Put ice in a plastic bag.  Place a towel between your skin and the bag.  Leave the ice on for 20 minutes, 2-3 times a day.  Remove the ice if your skin turns bright red. This is  very important. If you cannot feel pain, heat, or cold, you have a greater risk of damage to the area.    Activity  Rest the injured area.  Avoid strenuous activity and any activities or movements that cause pain. Be careful during activities, and avoid bumping the injured area.  Do not lift anything that is heavier than 5 lb (2.3 kg), or the limit that you are told, until your health care provider says that it is safe. General instructions  Do not use any products that contain nicotine or tobacco, such as cigarettes, e-cigarettes, and chewing tobacco. These can delay healing. If you need help quitting, ask your health care provider.  Do deep-breathing exercises as told by your health care provider.  If you were given an incentive spirometer, use it every 1-2 hours while you are awake, or as recommended by your health care provider. This device measures how well you are filling your lungs with each breath.  Keep all follow-up visits. This is important.    Contact a health care provider if you have:  Increased bruising or swelling.  Pain that is not controlled with treatment.  A fever. Get help right away if you:  Have difficulty breathing or shortness of breath.  Develop a continual cough, or you cough up thick or bloody mucus from your lungs (sputum).  Feel nauseous or you vomit.  Have pain in your abdomen. These symptoms may represent a serious problem that is an emergency. Do not wait to see if the symptoms will go away. Get medical  help right away. Call your local emergency services (911 in the [place] Do not drive yourself to the hospital. Summary  Rest the injured area. Avoid strenuous activity and any activities or movements that cause pain. This information is not intended to replace advice given to you by your health care provider. Make sure you discuss any questions you have with your health care provider. Document Revised: 09/10/2019 Document Reviewed:  09/10/2019 Elsevier Patient Education  2021 [employer].

## 2020-11-22 NOTE — MAU Provider Note (Signed)
  History     CSN: [id number]  Arrival date and time: 11/22/20 1727   None     Chief Complaint  Patient presents with  . Back Pain   HPI  Patient is a 25 y/o g1p0 at [redacted]w[redacted]d who presents for pain after falling on her back. Reports that yesterday she went kayaking and then got out and was stepping on a rock and fell directly on her right lower back. Reports ongoing pain in lower back. Denies vaginal bleeding or vision changes. Denies headache. 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 She is seeing ENT for this issue and reports no ongoing complaints of this right now.    OB History    Gravida  1   Para      Term      Preterm      AB      Living        SAB      IAB      Ectopic      Multiple      Live Births              Past Medical History:  Diagnosis Date  . Medical history non-contributory     Past Surgical History:  Procedure Laterality Date  . NO PAST SURGERIES      History reviewed. No pertinent family history.  Social History   Tobacco Use  . Smoking status: Never Smoker  . Smokeless tobacco: Never Used  Substance Use Topics  . Alcohol use: Not Currently  . Drug use: Never    Allergies: No Known Allergies  Medications Prior to Admission  Medication Sig Dispense Refill Last Dose  . Prenatal Vit-Fe Fumarate-FA (PRENATAL VITAMINS PO) Take 1 tablet by mouth daily.   Past Week at Unknown time    Review of Systems  Constitutional: Negative for chills and fatigue.  Gastrointestinal: Negative for abdominal pain, constipation, diarrhea, nausea and vomiting.  Genitourinary: Negative for dysuria, flank pain, hematuria, vaginal discharge and vaginal pain.  Neurological: Negative for light-headedness and headaches.   Physical Exam   Blood pressure 123/69, pulse (!) 103, temperature 98 F (36.7 C), temperature source Oral, resp. rate 20, height 5\' 3"  (1.6 m), weight 89.9  kg, last menstrual period 08/10/2020, SpO2 99 %.  Physical Exam Vitals and nursing note reviewed.  Constitutional:      Appearance: Normal appearance.  Pulmonary:     Effort: Pulmonary effort is normal.  Abdominal:     Palpations: Abdomen is soft.     Tenderness: There is no right CVA tenderness or left CVA tenderness.     Comments: Tender to palpation of right posterior rib cage. No bruising, bony step-offs, erythema. No swelling. Able to inhale fully, no splinting.   Neurological:     Mental Status: She is alert.     MAU Course  Procedures  MDM Patient evaluated at bedside VSS; normal fetal heart rate, no obstetrical concerns  Discussed with patient and stable for d/c home   Assessment and Plan   Right intercostal strain -discussed etiology and care plan. Offered muscle relaxant and patient declines; will trial tylenol and ice/heat, rest.  -stable for dc home   08/12/2020 11/22/2020, 6:23 PM

## 2020-11-22 NOTE — MAU Note (Signed)
Pt started noticing dizziness this weekend-does not feel that she is going to pass out-pt reports its with going from lying to standing or sitting to walking

## 2020-11-23 ENCOUNTER — Ambulatory Visit: Payer: Medicaid - Out of State

## 2020-11-23 ENCOUNTER — Ambulatory Visit: Payer: Medicaid - Out of State | Attending: Family

## 2020-12-14 DIAGNOSIS — H9120 Sudden idiopathic hearing loss, unspecified ear: Secondary | ICD-10-CM | POA: Insufficient documentation

## 2020-12-19 ENCOUNTER — Other Ambulatory Visit: Payer: Self-pay

## 2020-12-19 ENCOUNTER — Inpatient Hospital Stay (HOSPITAL_COMMUNITY)
Admission: AD | Admit: 2020-12-19 | Discharge: 2020-12-19 | Disposition: A | Discharge status: Home / Self Care | Payer: Medicaid Other | Attending: Obstetrics and Gynecology | Admitting: Obstetrics and Gynecology

## 2020-12-19 ENCOUNTER — Encounter (HOSPITAL_COMMUNITY): Payer: Self-pay | Admitting: Obstetrics and Gynecology

## 2020-12-19 DIAGNOSIS — O98812 Other maternal infectious and parasitic diseases complicating pregnancy, second trimester: Secondary | ICD-10-CM | POA: Insufficient documentation

## 2020-12-19 DIAGNOSIS — M549 Dorsalgia, unspecified: Secondary | ICD-10-CM | POA: Insufficient documentation

## 2020-12-19 DIAGNOSIS — Z3A17 17 weeks gestation of pregnancy: Secondary | ICD-10-CM | POA: Insufficient documentation

## 2020-12-19 DIAGNOSIS — O23593 Infection of other part of genital tract in pregnancy, third trimester: Secondary | ICD-10-CM | POA: Diagnosis not present

## 2020-12-19 DIAGNOSIS — R300 Dysuria: Secondary | ICD-10-CM | POA: Diagnosis present

## 2020-12-19 DIAGNOSIS — B3731 Acute candidiasis of vulva and vagina: Secondary | ICD-10-CM

## 2020-12-19 DIAGNOSIS — O99891 Other specified diseases and conditions complicating pregnancy: Secondary | ICD-10-CM | POA: Insufficient documentation

## 2020-12-19 DIAGNOSIS — B373 Candidiasis of vulva and vagina: Secondary | ICD-10-CM | POA: Insufficient documentation

## 2020-12-19 LAB — URINALYSIS, ROUTINE W REFLEX MICROSCOPIC
Bilirubin Urine: NEGATIVE
Glucose, UA: NEGATIVE mg/dL
Hgb urine dipstick: NEGATIVE
Ketones, ur: NEGATIVE mg/dL
Nitrite: NEGATIVE
Protein, ur: NEGATIVE mg/dL
Specific Gravity, Urine: 1.019 (ref 1.005–1.030)
pH: 5 (ref 5.0–8.0)

## 2020-12-19 LAB — WET PREP, GENITAL
Sperm: NONE SEEN
Trich, Wet Prep: NONE SEEN

## 2020-12-19 MED ORDER — TERCONAZOLE 0.4 % VA CREA: 1.0000 | TOPICAL_CREAM | Freq: Every day | VAGINAL | 0 refills | Status: DC

## 2020-12-19 NOTE — MAU Provider Note (Signed)
History     CSN: [id number]  Arrival date and time: 12/19/20 1618   Event Date/Time   First Provider Initiated Contact with Patient 12/19/20 1727      Chief Complaint  Patient presents with   Dysuria   HPI Cheryl Whitney 24 y.o. [redacted]w[redacted]d  Comes to MAU with dysuria - it feels like internal burning when she urinates like the UTI she had in 2018.  Has not tried any medication.  Has some back pain as well.  Was busy yesterday with vacuuming and cleaning her house.  OB History     Gravida  1   Para      Term      Preterm      AB      Living         SAB      IAB      Ectopic      Multiple      Live Births              Past Medical History:  Diagnosis Date   Medical history non-contributory     Past Surgical History:  Procedure Laterality Date   NO PAST SURGERIES      No family history on file.  Social History   Tobacco Use   Smoking status: Never   Smokeless tobacco: Never  Substance Use Topics   Alcohol use: Not Currently   Drug use: Never    Allergies: No Known Allergies  Medications Prior to Admission  Medication Sig Dispense Refill Last Dose   acetaminophen (TYLENOL) 500 MG tablet Take 2 tablets (1,000 mg total) by mouth every 8 (eight) hours as needed for moderate pain. 100 tablet 2    Prenatal Vit-Fe Fumarate-FA (PRENATAL VITAMINS PO) Take 1 tablet by mouth daily.       Review of Systems  Constitutional:  Negative for chills and fever.  Genitourinary:  Positive for dysuria. Negative for frequency, urgency, vaginal bleeding and vaginal discharge.  Musculoskeletal:  Positive for back pain.  Physical Exam   Blood pressure 114/65, pulse 98, temperature 98.4 F (36.9 C), temperature source Oral, resp. rate 18, height 5\' 3"  (1.6 m), weight 89.9 kg, last menstrual period 08/10/2020, SpO2 99 %.  Physical Exam Vitals and nursing note reviewed.  Constitutional:      Appearance: She is well-developed.  HENT:     Head:  Normocephalic.  Cardiovascular:     Rate and Rhythm: Regular rhythm.  Pulmonary:     Effort: Pulmonary effort is normal.  Abdominal:     Palpations: Abdomen is soft.     Tenderness: There is no abdominal tenderness. There is no guarding or rebound.  Genitourinary:    Comments: Vaginal inspection while doing vaginal swabs.  White adherent discharge noted.  Some erythema noted and some edema of labia seen. Musculoskeletal:        General: Normal range of motion.     Cervical back: Neck supple.  Skin:    General: Skin is warm and dry.  Neurological:     Mental Status: She is alert and oriented to person, place, and time.  Psychiatric:        Mood and Affect: Mood normal.        Behavior: Behavior normal.        Thought Content: Thought content normal.    MAU Course  Procedures  MDM Reviewed her urinalysis - no evidence of infection but with urinary casts seen, advised to increase PO  fluids every day. With visual appearance of yeast infection, medication sent to a 24 hour pharmacy so she can begin treatment today.  Assessment and Plan  Yeast infection  Plan Will treat with Terazol [facility] pending. [facility]. Drink at least 8 8-oz glasses of water every day.   Niambi Smoak L Jentri Aye 12/19/2020, 5:28 PM

## 2020-12-19 NOTE — MAU Note (Signed)
Has been having pelvic pain and burning with urination.  Feels like a UTI.  Having frequency and urgency, started Thursday night.

## 2020-12-19 NOTE — Discharge Instructions (Signed)
[facility]. Drink at least 8 8-oz glasses of water every day.

## 2020-12-20 ENCOUNTER — Telehealth: Payer: Self-pay | Admitting: Advanced Practice Midwife

## 2020-12-20 NOTE — Telephone Encounter (Signed)
Patient called MAU s/p evaluation yesterday with discharge diagnosis of yeast infection. Patient reports to Charge RN that her symptoms seem worse since starting prescribed Terazol. Patient requests callback from CNM.   Phone call attempted at 1210pm. Phone rang once, female voice picked up, then call disconnected. Will attempt again later.  Clayton Bibles, MSN, CNM Certified Nurse Midwife, [employer] for [employer], [facility] 12/20/20 12:12 PM

## 2020-12-21 LAB — GC/CHLAMYDIA PROBE AMP (~~LOC~~) NOT AT ARMC
Chlamydia: NEGATIVE
Comment: NEGATIVE
Comment: NORMAL
Neisseria Gonorrhea: NEGATIVE

## 2021-02-07 ENCOUNTER — Inpatient Hospital Stay (HOSPITAL_COMMUNITY)
Admission: AD | Admit: 2021-02-07 | Discharge: 2021-02-07 | Disposition: A | Discharge status: Home / Self Care | Payer: Medicaid Other | Attending: Family Medicine | Admitting: Family Medicine

## 2021-02-07 ENCOUNTER — Other Ambulatory Visit: Payer: Self-pay

## 2021-02-07 ENCOUNTER — Encounter (HOSPITAL_COMMUNITY): Payer: Self-pay | Admitting: Obstetrics & Gynecology

## 2021-02-07 DIAGNOSIS — Z3A24 24 weeks gestation of pregnancy: Secondary | ICD-10-CM | POA: Diagnosis not present

## 2021-02-07 DIAGNOSIS — G5600 Carpal tunnel syndrome, unspecified upper limb: Secondary | ICD-10-CM | POA: Insufficient documentation

## 2021-02-07 DIAGNOSIS — O26822 Pregnancy related peripheral neuritis, second trimester: Secondary | ICD-10-CM | POA: Diagnosis not present

## 2021-02-07 DIAGNOSIS — R030 Elevated blood-pressure reading, without diagnosis of hypertension: Secondary | ICD-10-CM | POA: Insufficient documentation

## 2021-02-07 DIAGNOSIS — O26899 Other specified pregnancy related conditions, unspecified trimester: Secondary | ICD-10-CM

## 2021-02-07 DIAGNOSIS — O26892 Other specified pregnancy related conditions, second trimester: Secondary | ICD-10-CM | POA: Insufficient documentation

## 2021-02-07 LAB — COMPREHENSIVE METABOLIC PANEL
ALT: 19 U/L (ref 0–44)
AST: 22 U/L (ref 15–41)
Albumin: 2.9 g/dL — ABNORMAL LOW (ref 3.5–5.0)
Alkaline Phosphatase: 76 U/L (ref 38–126)
Anion gap: 8 (ref 5–15)
BUN: 10 mg/dL (ref 6–20)
CO2: 21 mmol/L — ABNORMAL LOW (ref 22–32)
Calcium: 8.9 mg/dL (ref 8.9–10.3)
Chloride: 105 mmol/L (ref 98–111)
Creatinine, Ser: 0.57 mg/dL (ref 0.44–1.00)
GFR, Estimated: >60 mL/min (ref >60)
Glucose, Bld: 90 mg/dL (ref 70–99)
Potassium: 3.8 mmol/L (ref 3.5–5.1)
Sodium: 134 mmol/L — ABNORMAL LOW (ref 135–145)
Total Bilirubin: 0.5 mg/dL (ref 0.3–1.2)
Total Protein: 6.6 g/dL (ref 6.5–8.1)

## 2021-02-07 LAB — URINALYSIS, ROUTINE W REFLEX MICROSCOPIC
Bilirubin Urine: NEGATIVE
Glucose, UA: 50 mg/dL — AB
Hgb urine dipstick: NEGATIVE
Ketones, ur: NEGATIVE mg/dL
Nitrite: NEGATIVE
Protein, ur: NEGATIVE mg/dL
Specific Gravity, Urine: 1.017 (ref 1.005–1.030)
pH: 6 (ref 5.0–8.0)

## 2021-02-07 LAB — CBC
HCT: 33.2 % — ABNORMAL LOW (ref 36.0–46.0)
Hemoglobin: 10.8 g/dL — ABNORMAL LOW (ref 12.0–15.0)
MCH: 28.7 pg (ref 26.0–34.0)
MCHC: 32.5 g/dL (ref 30.0–36.0)
MCV: 88.3 fL (ref 80.0–100.0)
Platelets: 249 10*3/uL (ref 150–400)
RBC: 3.76 MIL/uL — ABNORMAL LOW (ref 3.87–5.11)
RDW: 13.3 % (ref 11.5–15.5)
WBC: 12.2 10*3/uL — ABNORMAL HIGH (ref 4.0–10.5)
nRBC: 0 % (ref 0.0–0.2)

## 2021-02-07 NOTE — MAU Note (Signed)
Has had pretty high BP today.  Has had a HA, fingers feel numb and pelvic pain.  Took Tylenol, helped for a little while and then returns.  Has had nosebleeds this past wk.  Has not called dr. Denies visual changes, epigastric pain or increase in swelling.

## 2021-02-07 NOTE — MAU Provider Note (Addendum)
History     CSN: [id number]  Arrival date and time: 02/07/21 1728   Event Date/Time   First Provider Initiated Contact with Patient 02/07/21 1905      Chief Complaint  Patient presents with   Pelvic Pain   Headache   Hypertension   HPI This is a 24yo G1 at [redacted]w[redacted]d. Patient seen for HA, elevated BP, finger numbness. Her headache started a couple days ago. Gets better with tylenol, but never goes completely away. She was at work today and checked her BP - BP was 140s/90s and came for evaluation.  Numbness in fingertips (on right) for several days. Does have numbness in hand at night.  OB History     Gravida  1   Para      Term      Preterm      AB      Living         SAB      IAB      Ectopic      Multiple      Live Births              Past Medical History:  Diagnosis Date   Medical history non-contributory     Past Surgical History:  Procedure Laterality Date   NO PAST SURGERIES      History reviewed. No pertinent family history.  Social History   Tobacco Use   Smoking status: Never   Smokeless tobacco: Never  Substance Use Topics   Alcohol use: Not Currently   Drug use: Never    Allergies: No Known Allergies  Medications Prior to Admission  Medication Sig Dispense Refill Last Dose   acetaminophen (TYLENOL) 500 MG tablet Take 2 tablets (1,000 mg total) by mouth every 8 (eight) hours as needed for moderate pain. 100 tablet 2 02/06/2021 at 2300   Prenatal Vit-Fe Fumarate-FA (PRENATAL VITAMINS PO) Take 1 tablet by mouth daily.   02/07/2021   terconazole (TERAZOL 7) 0.4 % vaginal cream Place 1 applicator vaginally at bedtime. For 7 days 45 g 0     Review of Systems  Constitutional: Negative.   HENT:  Positive for nosebleeds.   Gastrointestinal: Negative.   Neurological:  Positive for headaches.  Physical Exam   Blood pressure 130/84, pulse (!) 112, temperature 98.7 F (37.1 C), temperature source Oral, resp. rate 18, height 5\' 3"  (1.6  m), weight 94.2 kg, last menstrual period 08/10/2020, SpO2 99 %.  Patient Vitals for the past 24 hrs:  BP Temp Temp src Pulse Resp SpO2 Height Weight  02/07/21 2016 130/84 -- -- (!) 112 -- 99 % -- --  02/07/21 2001 120/83 -- -- (!) 101 -- 98 % -- --  02/07/21 1945 125/79 -- -- (!) 104 -- 99 % -- --  02/07/21 1930 (!) 132/92 -- -- (!) 111 -- 98 % -- --  02/07/21 1915 125/84 -- -- (!) 109 -- 98 % -- --  02/07/21 1901 130/90 -- -- (!) 104 -- 98 % -- --  02/07/21 1846 127/80 -- -- (!) 113 -- 98 % -- --  02/07/21 1830 -- -- -- -- -- 99 % -- --  02/07/21 1829 137/80 -- -- (!) 107 -- -- -- --  02/07/21 1815 121/73 98.7 F (37.1 C) Oral 94 18 99 % 5\' 3"  (1.6 m) 94.2 kg    Physical Exam Vitals and nursing note reviewed.  Constitutional:      Appearance: She is well-developed.  Cardiovascular:  Rate and Rhythm: Normal rate and regular rhythm.  Pulmonary:     Effort: Pulmonary effort is normal.     Breath sounds: Normal breath sounds.  Abdominal:     General: Bowel sounds are normal. There is no distension.     Palpations: Abdomen is soft. There is no mass.     Tenderness: There is no abdominal tenderness. There is no guarding.  Neurological:     Mental Status: She is alert.     Comments: Tinnel's sign positive.  Psychiatric:        Mood and Affect: Mood normal.        Behavior: Behavior normal.   NST:  Baseline: 150 bpm, Variability: Good {> 6 bpm), Accelerations: Reactive, and Decelerations: Absent  MAU Course  Procedures Results for orders placed or performed during the hospital encounter of 02/07/21 (from the past 24 hour(s))  Urinalysis, Routine w reflex microscopic Urine, Clean Catch     Status: Abnormal   Collection Time: 02/07/21  6:40 PM  Result Value Ref Range   Color, Urine YELLOW YELLOW   APPearance CLOUDY (A) CLEAR   Specific Gravity, Urine 1.017 1.005 - 1.030   pH 6.0 5.0 - 8.0   Glucose, UA 50 (A) NEGATIVE mg/dL   Hgb urine dipstick NEGATIVE NEGATIVE    Bilirubin Urine NEGATIVE NEGATIVE   Ketones, ur NEGATIVE NEGATIVE mg/dL   Protein, ur NEGATIVE NEGATIVE mg/dL   Nitrite NEGATIVE NEGATIVE   Leukocytes,Ua LARGE (A) NEGATIVE   RBC / HPF 0-5 0 - 5 RBC/hpf   WBC, UA 6-10 0 - 5 WBC/hpf   Bacteria, UA MANY (A) NONE SEEN   Squamous Epithelial / LPF 21-50 0 - 5   Mucus PRESENT   CBC     Status: Abnormal   Collection Time: 02/07/21  6:51 PM  Result Value Ref Range   WBC 12.2 (H) 4.0 - 10.5 K/uL   RBC 3.76 (L) 3.87 - 5.11 MIL/uL   Hemoglobin 10.8 (L) 12.0 - 15.0 g/dL   HCT 36.6 (L) 44.0 - 34.7 %   MCV 88.3 80.0 - 100.0 fL   MCH 28.7 26.0 - 34.0 pg   MCHC 32.5 30.0 - 36.0 g/dL   RDW 42.5 95.6 - 38.7 %   Platelets 249 150 - 400 K/uL   nRBC 0.0 0.0 - 0.2 %  Comprehensive metabolic panel     Status: Abnormal   Collection Time: 02/07/21  6:51 PM  Result Value Ref Range   Sodium 134 (L) 135 - 145 mmol/L   Potassium 3.8 3.5 - 5.1 mmol/L   Chloride 105 98 - 111 mmol/L   CO2 21 (L) 22 - 32 mmol/L   Glucose, Bld 90 70 - 99 mg/dL   BUN 10 6 - 20 mg/dL   Creatinine, Ser 5.64 0.44 - 1.00 mg/dL   Calcium 8.9 8.9 - 33.2 mg/dL   Total Protein 6.6 6.5 - 8.1 g/dL   Albumin 2.9 (L) 3.5 - 5.0 g/dL   AST 22 15 - 41 U/L   ALT 19 0 - 44 U/L   Alkaline Phosphatase 76 38 - 126 U/L   Total Bilirubin 0.5 0.3 - 1.2 mg/dL   GFR, Estimated >95 >18 mL/min   Anion gap 8 5 - 15    MDM  Care turned over to Judeth Horn, FNP.  Levie Heritage, DO 02/07/2021 8:13 PM  Patient with mildly elevated DBP x 2 in MAU. Asymptomatic during this encounter & labs negative. She is currently a  [facility] patient. Will have BP check at [facility] later this week to determine if she can continue ob care there or will need to transfer to [facility]  Assessment and Plan   1. Elevated BP without diagnosis of hypertension  -msg to [facility] for BP check later this week - if remains elevated will need to transfer to our office. If normal patient will continue care at [facility]  2. Carpal tunnel  syndrome during pregnancy  -wrist splint at night  3. [redacted] weeks gestation of pregnancy    Judeth Horn, NP

## 2021-03-11 LAB — GLUCOSE TOLERANCE, 1 HOUR: Glucose, 1 Hour GTT: 200

## 2021-03-11 LAB — OB RESULTS CONSOLE RPR: RPR: NONREACTIVE

## 2021-03-15 ENCOUNTER — Encounter (HOSPITAL_COMMUNITY): Payer: Self-pay | Admitting: Obstetrics & Gynecology

## 2021-03-15 ENCOUNTER — Inpatient Hospital Stay (HOSPITAL_COMMUNITY)
Admission: AD | Admit: 2021-03-15 | Discharge: 2021-03-15 | Disposition: A | Discharge status: Home / Self Care | Payer: Medicaid Other | Attending: Obstetrics & Gynecology | Admitting: Obstetrics & Gynecology

## 2021-03-15 ENCOUNTER — Other Ambulatory Visit: Payer: Self-pay

## 2021-03-15 DIAGNOSIS — R109 Unspecified abdominal pain: Secondary | ICD-10-CM | POA: Insufficient documentation

## 2021-03-15 DIAGNOSIS — Z3A29 29 weeks gestation of pregnancy: Secondary | ICD-10-CM | POA: Insufficient documentation

## 2021-03-15 DIAGNOSIS — O26899 Other specified pregnancy related conditions, unspecified trimester: Secondary | ICD-10-CM

## 2021-03-15 DIAGNOSIS — O98813 Other maternal infectious and parasitic diseases complicating pregnancy, third trimester: Secondary | ICD-10-CM | POA: Diagnosis not present

## 2021-03-15 DIAGNOSIS — O26893 Other specified pregnancy related conditions, third trimester: Secondary | ICD-10-CM | POA: Diagnosis not present

## 2021-03-15 DIAGNOSIS — B372 Candidiasis of skin and nail: Secondary | ICD-10-CM | POA: Diagnosis not present

## 2021-03-15 HISTORY — DX: Gestational diabetes mellitus in pregnancy, unspecified control: O24.419

## 2021-03-15 LAB — URINALYSIS, ROUTINE W REFLEX MICROSCOPIC
Bacteria, UA: NONE SEEN
Bilirubin Urine: NEGATIVE
Glucose, UA: 150 mg/dL — AB
Hgb urine dipstick: NEGATIVE
Ketones, ur: 5 mg/dL — AB
Leukocytes,Ua: NEGATIVE
Nitrite: NEGATIVE
Protein, ur: 30 mg/dL — AB
Specific Gravity, Urine: 1.025 (ref 1.005–1.030)
pH: 6 (ref 5.0–8.0)

## 2021-03-15 MED ORDER — TRIAMCINOLONE ACETONIDE 0.5 % EX OINT: 1.0000 "application " | TOPICAL_OINTMENT | Freq: Two times a day (BID) | CUTANEOUS | 0 refills | Status: DC

## 2021-03-15 MED ORDER — NYSTATIN 100000 UNIT/GM EX OINT: 1.0000 "application " | TOPICAL_OINTMENT | Freq: Two times a day (BID) | CUTANEOUS | 0 refills | Status: DC

## 2021-03-15 NOTE — Discharge Instructions (Addendum)
Please apply BOTH ointments to affected areas twice daily. Once you bathe, be sure to dry really well with towel followed by hair dryer on low/cool setting until skin is completely dry. Please follow up the skin yeast infection at your next appointment on 04/06/2021.

## 2021-03-15 NOTE — MAU Provider Note (Addendum)
History     CSN: [id number]  Arrival date and time: 03/15/21 1736   Event Date/Time   First Provider Initiated Contact with Patient 03/15/21 1831      Chief Complaint  Patient presents with   Rash   Abdominal Pain   Cheryl Whitney is a 25 yo F G1P0 at [redacted]w[redacted]d. She presents with cramping bilateral lower abdominal pain starting today around noon. Pain sometimes stretches across the center of her lower abdomen as well. Pain comes and goes, rates it 5-7/10. Denies vaginal bleeding, discharge. She reports normal fetal movement. Denies vomiting and diarrhea. Endorses some mild nausea yesterday.   Rash started 3 days ago on her chest and then began to spread under breasts, groin, and back. Rash has been itchy and pink since onset. She does not recall having a rash like this before. She had tried Benadryl cream and cortisone cream with mild itch relief. Denies new detergent or hygiene products. No known allergens. States she ate her normal diet this weekend, but did eat at [facility] on Saturday. Reports she does have sensitive skin.   Abdominal Pain This is a recurrent problem. The current episode started today. The problem occurs 2 to 4 times per day. The most recent episode lasted 1 hour. The problem has been waxing and waning since onset. The pain is located in the LLQ and RLQ. The pain is at a severity of 5/10. The quality of the pain is described as cramping. The pain radiates to the suprapubic region. Associated symptoms include a rash. Pertinent negatives include no diarrhea, dysuria, fever or vomiting.  Rash This is a new problem. The current episode started in the past 7 days. The problem has been gradually worsening since onset. The affected locations include the groin, chest, back and right axilla. The rash is characterized by itchiness and redness. It is unknown if there was an exposure to a precipitant. Pertinent negatives include no diarrhea, facial edema, fever, shortness of breath or  vomiting. Past treatments include anti-itch cream and topical steroids. The treatment provided mild relief. There is no history of asthma.   OB History     Gravida  1   Para      Term      Preterm      AB      Living         SAB      IAB      Ectopic      Multiple      Live Births              Past Medical History:  Diagnosis Date   Gestational diabetes    Medical history non-contributory     Past Surgical History:  Procedure Laterality Date   NO PAST SURGERIES      History reviewed. No pertinent family history.  Social History   Tobacco Use   Smoking status: Never   Smokeless tobacco: Never  Substance Use Topics   Alcohol use: Not Currently   Drug use: Never    Allergies: No Known Allergies  Medications Prior to Admission  Medication Sig Dispense Refill Last Dose   Prenatal Vit-Fe Fumarate-FA (PRENATAL VITAMINS PO) Take 1 tablet by mouth daily.   03/14/2021   acetaminophen (TYLENOL) 500 MG tablet Take 2 tablets (1,000 mg total) by mouth every 8 (eight) hours as needed for moderate pain. 100 tablet 2    terconazole (TERAZOL 7) 0.4 % vaginal cream Place 1 applicator vaginally at bedtime. For  7 days 45 g 0     Review of Systems  Constitutional:  Negative for fever.  HENT:  Negative for facial swelling and trouble swallowing.   Respiratory:  Negative for shortness of breath.   Gastrointestinal:  Positive for abdominal pain. Negative for diarrhea and vomiting.  Genitourinary:  Negative for dysuria, vaginal bleeding and vaginal discharge.  Skin:  Positive for rash.  Allergic/Immunologic: Negative for environmental allergies and food allergies.  Physical Exam   Blood pressure 124/77, pulse (!) 113, temperature 98.2 F (36.8 C), temperature source Oral, resp. rate 19, height 5\' 3"  (1.6 m), weight 105.1 kg, last menstrual period 08/10/2020, SpO2 98 %.  Physical Exam Vitals and nursing note reviewed.  Constitutional:      General: She is not in  acute distress.    Appearance: She is obese.  HENT:     Head: Normocephalic and atraumatic.  Eyes:     Extraocular Movements: Extraocular movements intact.  Cardiovascular:     Rate and Rhythm: Regular rhythm. Tachycardia present.  Pulmonary:     Effort: Pulmonary effort is normal.     Breath sounds: Normal breath sounds. No wheezing.  Abdominal:     General: Bowel sounds are normal.     Tenderness: There is no abdominal tenderness.     Comments: Gravid  Skin:    General: Skin is warm and dry.     Findings: Rash (Pink macules and patches on chest, under b/l breasts and axilla, groin, upper back) present.  Neurological:     Mental Status: She is alert and oriented to person, place, and time.  Psychiatric:        Mood and Affect: Mood normal.        Behavior: Behavior normal.    MAU Course  Procedures  MDM Intertrigo with fungal etiology likely considering placement under breasts and in groin.   Assessment and Plan   Cutaneous candidiasis - treat with Nystatin and triamcinolone cream x 7 days or until rash resolves and for 3 days following resolution. Fully dry skin folds after bathing/showering.  Abdominal cramping affecting pregnancy, third trimester - oral hydration   3.   [redacted] weeks gestation of pregnancy   08/12/2020 03/15/2021, 7:47 PM   Attestation of Supervision of Student:  I confirm that I have verified the information documented in the physician assistant student's note and that I have also personally reperformed the history, physical exam and all medical decision making activities.  I have verified that all services and findings are accurately documented in this student's note; and I agree with management and plan as outlined in the documentation. I have also made any necessary editorial changes.  Rx for Nystatin ointment applied to all affected areas BID and Triamcinolone 0.5% ointment applied to all affected areas BID. Advised to dry really well with towel  followed by hair dryer on low/cool setting until skin is completely dry. Follow-up at next OB appt on 04/06/2021. Patient verbalized an understanding of the plan of care and agrees.   04/08/2021, CNM [facility], [facility] 03/15/2021 8:31 PM

## 2021-03-15 NOTE — MAU Note (Signed)
Presents stating she's having.lower abdominal cramping that began this afternoon @ 1230.  Denies VB or LOF.  Endorses +FM. Also states she has a rash that began on Saturday, reports rash was initially on chest, but has now spread under her breast, onto her neck, back, and thighs.  States rash is itchy, has used Benadryl & Cortisone creams.

## 2021-03-21 ENCOUNTER — Other Ambulatory Visit: Payer: Self-pay | Admitting: Family Medicine

## 2021-03-21 ENCOUNTER — Encounter (HOSPITAL_COMMUNITY): Payer: Self-pay | Admitting: Obstetrics & Gynecology

## 2021-03-21 ENCOUNTER — Inpatient Hospital Stay (HOSPITAL_COMMUNITY)
Admission: AD | Admit: 2021-03-21 | Discharge: 2021-03-21 | Disposition: A | Discharge status: Home / Self Care | Payer: Medicaid Other | Attending: Family Medicine | Admitting: Family Medicine

## 2021-03-21 ENCOUNTER — Other Ambulatory Visit: Payer: Self-pay

## 2021-03-21 DIAGNOSIS — Z20822 Contact with and (suspected) exposure to covid-19: Secondary | ICD-10-CM | POA: Insufficient documentation

## 2021-03-21 DIAGNOSIS — J029 Acute pharyngitis, unspecified: Secondary | ICD-10-CM | POA: Insufficient documentation

## 2021-03-21 DIAGNOSIS — J069 Acute upper respiratory infection, unspecified: Secondary | ICD-10-CM

## 2021-03-21 DIAGNOSIS — R21 Rash and other nonspecific skin eruption: Secondary | ICD-10-CM | POA: Diagnosis present

## 2021-03-21 DIAGNOSIS — O99513 Diseases of the respiratory system complicating pregnancy, third trimester: Secondary | ICD-10-CM

## 2021-03-21 DIAGNOSIS — B372 Candidiasis of skin and nail: Secondary | ICD-10-CM | POA: Diagnosis not present

## 2021-03-21 DIAGNOSIS — O98813 Other maternal infectious and parasitic diseases complicating pregnancy, third trimester: Secondary | ICD-10-CM | POA: Insufficient documentation

## 2021-03-21 DIAGNOSIS — O26893 Other specified pregnancy related conditions, third trimester: Secondary | ICD-10-CM

## 2021-03-21 DIAGNOSIS — Z363 Encounter for antenatal screening for malformations: Secondary | ICD-10-CM

## 2021-03-21 DIAGNOSIS — Z3A30 30 weeks gestation of pregnancy: Secondary | ICD-10-CM

## 2021-03-21 LAB — RESP PANEL BY RT-PCR (FLU A&B, COVID) ARPGX2
Influenza A by PCR: NEGATIVE
Influenza B by PCR: NEGATIVE
SARS Coronavirus 2 by RT PCR: NEGATIVE

## 2021-03-21 MED ORDER — CLOTRIMAZOLE-BETAMETHASONE 1-0.05 % EX CREA: TOPICAL_CREAM | CUTANEOUS | 1 refills | Status: DC

## 2021-03-21 NOTE — MAU Note (Signed)
Pt reports she has been see for a rash last week. Given some oinments but the rash is not any better. Stated she had had low grade fevers at night 99-100.0.  C/O headache and nausea. Taking benadryl and it is not helping with itching make her headache worse.

## 2021-03-21 NOTE — MAU Provider Note (Addendum)
History     CSN: [id number]  Arrival date and time: 03/21/21 1730   Event Date/Time   First Provider Initiated Contact with Patient 03/21/21 1823       Chief Complaint  Patient presents with   Rash   HPI  This is a 24yo G1P0 at [redacted]w[redacted]d who presents with rash that started a little more than a week ago.  The rash started on her chest to her armpits and on her neck pain is also in her thighs bilaterally.  She was seen in the MAU and was prescribed nystatin and triamcinolone.  She has been applying the screenings without too much improvement.  No other palliating or provoking factors.  Her symptoms appear to be worsening. These areas are extensively pruritic.  Additionally, she has intermittent fevers and night sweats for the past 4 nights.  She has nasal congestion, cough, sore throat.  OB History     Gravida  1   Para      Term      Preterm      AB      Living         SAB      IAB      Ectopic      Multiple      Live Births              Past Medical History:  Diagnosis Date   Gestational diabetes    Medical history non-contributory     Past Surgical History:  Procedure Laterality Date   NO PAST SURGERIES      Family History  Problem Relation Age of Onset   Healthy Mother    Healthy Father     Social History   Tobacco Use   Smoking status: Never   Smokeless tobacco: Never  Vaping Use   Vaping Use: Never used  Substance Use Topics   Alcohol use: Not Currently   Drug use: Never    Allergies: No Known Allergies  Medications Prior to Admission  Medication Sig Dispense Refill Last Dose   acetaminophen (TYLENOL) 500 MG tablet Take 2 tablets (1,000 mg total) by mouth every 8 (eight) hours as needed for moderate pain. 100 tablet 2 03/21/2021 at 0800   nystatin ointment (MYCOSTATIN) Apply 1 application topically 2 (two) times daily. 30 g 0 03/21/2021 at 0800   Prenatal Vit-Fe Fumarate-FA (PRENATAL VITAMINS PO) Take 1 tablet by mouth daily.    03/20/2021   triamcinolone ointment (KENALOG) 0.5 % Apply 1 application topically 2 (two) times daily. 30 g 0 03/21/2021 at 0800    Review of Systems Physical Exam   Blood pressure 125/67, pulse (!) 117, temperature 98 F (36.7 C), resp. rate 19, last menstrual period 08/10/2020, SpO2 99 %.  Physical Exam Vitals and nursing note reviewed.  Constitutional:      Appearance: Normal appearance.  Cardiovascular:     Rate and Rhythm: Normal rate and regular rhythm.     Pulses: Normal pulses.     Heart sounds: Normal heart sounds.  Pulmonary:     Effort: Pulmonary effort is normal.     Breath sounds: Normal breath sounds.  Skin:    Comments: Slightly raised erythematous rash incorporating much of her breasts bilaterally, extending into the axilla bilaterally.  She also has a slightly raised erythematous area in the inguinal intertriginous space bilaterally.  Neurological:     General: No focal deficit present.     Mental Status: She is alert and oriented to  person, place, and time.  Psychiatric:        Mood and Affect: Mood normal.        Behavior: Behavior normal.        Thought Content: Thought content normal.        Judgment: Judgment normal.    MAU Course  Procedures NST:  Baseline: 130  Variability: mod Accelerations: ++  Decelerations: None Contractions: none  MDM  Assessment and Plan   1. Yeast dermatitis   2. Viral upper respiratory tract infection   3. [redacted] weeks gestation of pregnancy    As patient is pregnant, fluconazole is contraindicated.  We will treat with Lotrisone.  We will have patient follow-up in the office in the next couple of days.  We will also screen patient for COVID.  Symptomatic treatment with guaifenesin with dextromethorphan, tylenol, etc.  Rhona Raider [facility] 03/21/2021, 6:22 PM

## 2021-03-30 ENCOUNTER — Ambulatory Visit: Payer: Medicaid Other | Attending: Family Medicine

## 2021-03-30 ENCOUNTER — Other Ambulatory Visit: Payer: Self-pay | Admitting: *Deleted

## 2021-03-30 ENCOUNTER — Ambulatory Visit (HOSPITAL_BASED_OUTPATIENT_CLINIC_OR_DEPARTMENT_OTHER): Payer: Medicaid Other | Admitting: Obstetrics

## 2021-03-30 ENCOUNTER — Encounter: Payer: Self-pay | Admitting: *Deleted

## 2021-03-30 ENCOUNTER — Ambulatory Visit: Payer: Medicaid Other | Admitting: *Deleted

## 2021-03-30 ENCOUNTER — Other Ambulatory Visit: Payer: Self-pay

## 2021-03-30 VITALS — BP 108/66 | HR 131

## 2021-03-30 DIAGNOSIS — O24410 Gestational diabetes mellitus in pregnancy, diet controlled: Secondary | ICD-10-CM

## 2021-03-30 DIAGNOSIS — Z6830 Body mass index (BMI) 30.0-30.9, adult: Secondary | ICD-10-CM

## 2021-03-30 DIAGNOSIS — Z3A31 31 weeks gestation of pregnancy: Secondary | ICD-10-CM | POA: Diagnosis not present

## 2021-03-30 DIAGNOSIS — Z363 Encounter for antenatal screening for malformations: Secondary | ICD-10-CM | POA: Insufficient documentation

## 2021-03-30 NOTE — Progress Notes (Signed)
MFM Note  Cheryl Whitney was seen today due to recently diagnosed diet-controlled gestational diabetes.  Her fundal heights have been measuring larger than her dates.  The patient's care is being transferred from the [facility] Department to the Faculty Practice due to her diagnosis of gestational diabetes.  She has not started monitoring her fingersticks on a daily basis yet.  She denies any other problems in her current pregnancy.  She had a quad screen drawn earlier in her pregnancy that indicated a low risk for trisomy 28 and 64.  She was informed that the fetal growth and amniotic fluid level appears appropriate for her gestational age.  The overall EFW measured in the larger normal range (88th percentile).  The views of the fetal anatomy were limited today due to her advanced gestational age.  However, there were no obvious fetal anomalies noted.  The limitations of ultrasound in the detection of all anomalies was discussed.  During our consultation today, the following were discussed:  Gestational diabetes in pregnancy  Arrangements should be made for the patient to receive diabetic teaching and for her to get fingerstick testing supplies.  The implications and management of diabetes in pregnancy was discussed in detail with the patient.  She was advised to start monitoring her fingersticks 4 times daily (fasting and 2 hours after each meal) once she receives the testing supplies.  She was advised that our goals for her fingerstick values are fasting values of 90-95 or less and two-hour postprandial values of 120 or less.    Should the majority of her fingerstick results be above these values, she may have to be started on metformin or insulin to help her achieve better glycemic control. The patient was advised that getting her fingerstick values as close to these goals as possible would provide her with the most optimal obstetrical outcome.  A follow-up exam was scheduled  in 4 weeks to assess the fetal growth.  However, she should be seen for a weekly fetal testing using either NSTs or BPP's should she require either insulin or metformin for treatment.  The increased risk of polyhydramnios, fetal macrosomia, and preeclampsia associated with diabetes was also discussed.    The patient was advised that delivery for well-controlled diabetes in pregnancy is usually recommended at around 39 weeks.  Delivery at 37 weeks may be considered should her glycemic control be poor.  A total of 30 minutes was spent counseling and coordinating the care for this patient.  Greater than 50% of the time was spent in direct face-to-face contact.

## 2021-04-06 ENCOUNTER — Encounter: Payer: Self-pay | Admitting: Family Medicine

## 2021-04-06 ENCOUNTER — Ambulatory Visit (INDEPENDENT_AMBULATORY_CARE_PROVIDER_SITE_OTHER): Payer: Medicaid Other | Admitting: Family Medicine

## 2021-04-06 ENCOUNTER — Other Ambulatory Visit: Payer: Self-pay

## 2021-04-06 VITALS — BP 137/84 | HR 116

## 2021-04-06 DIAGNOSIS — O0990 Supervision of high risk pregnancy, unspecified, unspecified trimester: Secondary | ICD-10-CM

## 2021-04-06 DIAGNOSIS — Z8632 Personal history of gestational diabetes: Secondary | ICD-10-CM | POA: Insufficient documentation

## 2021-04-06 DIAGNOSIS — O24419 Gestational diabetes mellitus in pregnancy, unspecified control: Secondary | ICD-10-CM

## 2021-04-06 LAB — GLUCOSE, CAPILLARY: Glucose-Capillary: 137 mg/dL — ABNORMAL HIGH (ref 70–99)

## 2021-04-06 NOTE — Patient Instructions (Signed)

## 2021-04-06 NOTE — Progress Notes (Signed)
Subjective:   Cheryl Whitney is a 25 y.o. G1P0 at [redacted]w[redacted]d by early ultrasound being seen today for her first obstetrical visit.  Her obstetrical history is significant for  GDM . Patient does intend to breast feed. Pregnancy history fully reviewed.  Transfer from [facility] due to diagnosis of GDM, currently diet controlled.   Patient reports no complaints.  HISTORY: OB History  Gravida Para Term Preterm AB Living  1 0 0 0 0 0  SAB IAB Ectopic Multiple Live Births  0 0 0 0 0    # Outcome Date GA Lbr Len/2nd Weight Sex Delivery Anes PTL Lv  1 Current              Last pap smear:  Normal 11/04/20 per [facility] records  Past Medical History:  Diagnosis Date   Gestational diabetes    Hearing loss in right ear    Medical history non-contributory    Past Surgical History:  Procedure Laterality Date   NO PAST SURGERIES     Family History  Problem Relation Age of Onset   Healthy Mother    Healthy Father    Social History   Tobacco Use   Smoking status: Never   Smokeless tobacco: Never  Vaping Use   Vaping Use: Never used  Substance Use Topics   Alcohol use: Not Currently   Drug use: Never   No Known Allergies Current Outpatient Medications on File Prior to Visit  Medication Sig Dispense Refill   acetaminophen (TYLENOL) 500 MG tablet Take 2 tablets (1,000 mg total) by mouth every 8 (eight) hours as needed for moderate pain. 100 tablet 2   Prenatal Vit-Fe Fumarate-FA (PRENATAL VITAMINS PO) Take 1 tablet by mouth daily.     No current facility-administered medications on file prior to visit.     Exam   Vitals:   04/06/21 1423  BP: 137/84  Pulse: (!) 116   Fetal Heart Rate (bpm): 152  System: General: well-developed, well-nourished female in no acute distress   Skin: normal coloration and turgor, no rashes   Neurologic: oriented, normal, negative, normal mood   Extremities: normal strength, tone, and muscle mass, ROM of all joints is normal   HEENT  PERRLA, extraocular movement intact and sclera clear, anicteric   Neck supple and no masses   Respiratory:  no respiratory distress      Assessment:   Pregnancy: G1P0 Patient Active Problem List   Diagnosis Date Noted   Supervision of high risk pregnancy, antepartum 04/06/2021   Gestational diabetes 04/06/2021     Plan:  1. Supervision of high risk pregnancy, antepartum Initial labs reviewed, normal Continue prenatal vitamins. Genetic Screening discussed, Quad screen: results reviewed. Ultrasound discussed; fetal anatomic survey: results reviewed. Problem list reviewed and updated. The nature of Cheryl [facility] with multiple MDs and other Advanced Practice Providers was explained to patient; also emphasized that residents, students are part of our team. - [facility] AMB BABYSCRIPTS SCHEDULE OPTIMIZATION  2. Gestational diabetes mellitus (GDM), antepartum, gestational diabetes method of control unspecified Diagnostic 1hr GTT at [facility] with value of 200, 3hr not done Fingerstick today almost 4hr postpartum was 137 Discussed importance of glucose control for optimal outcomes, RN provided meter teaching She has an appt with DM educator next week, discussed it is likely she will needs meds Already following w MFM for growth scans, borderline macrosomic at 88% last scan, no indication for antenatal testing at present - Ambulatory referral  to Nutrition and Diabetic Education   Routine obstetric precautions reviewed. Return in 2 weeks (on 04/20/2021) for [facility], ob visit.

## 2021-04-12 ENCOUNTER — Other Ambulatory Visit: Payer: Medicaid Other

## 2021-04-21 ENCOUNTER — Ambulatory Visit (INDEPENDENT_AMBULATORY_CARE_PROVIDER_SITE_OTHER): Payer: Medicaid Other | Admitting: Family Medicine

## 2021-04-21 ENCOUNTER — Other Ambulatory Visit: Payer: Self-pay

## 2021-04-21 VITALS — BP 128/88 | HR 108 | Wt 223.4 lb

## 2021-04-21 DIAGNOSIS — O0990 Supervision of high risk pregnancy, unspecified, unspecified trimester: Secondary | ICD-10-CM | POA: Diagnosis not present

## 2021-04-21 DIAGNOSIS — O24410 Gestational diabetes mellitus in pregnancy, diet controlled: Secondary | ICD-10-CM | POA: Diagnosis not present

## 2021-04-21 NOTE — Progress Notes (Signed)
   PRENATAL VISIT NOTE  Subjective:  Cheryl Whitney is a 25 y.o. G1P0 at [redacted]w[redacted]d being seen today for ongoing prenatal care.  She is currently monitored for the following issues for this high-risk pregnancy and has Supervision of high risk pregnancy, antepartum; Gestational diabetes; and Sudden-onset sensorineural hearing loss on their problem list.  Patient reports no complaints.  Contractions: Irritability. Vag. Bleeding: None.  Movement: Present. Denies leaking of fluid.   The following portions of the patient's history were reviewed and updated as appropriate: allergies, current medications, past family history, past medical history, past social history, past surgical history and problem list.   Objective:   Vitals:   04/21/21 1604  BP: 128/88  Pulse: (!) 108  Weight: 223 lb 6.4 oz (101.3 kg)    Fetal Status: Fetal Heart Rate (bpm): 165 Fundal Height: 34 cm Movement: Present  Presentation: Undeterminable  General:  Alert, oriented and cooperative. Patient is in no acute distress.  Skin: Skin is warm and dry. No rash noted.   Cardiovascular: Normal heart rate noted  Respiratory: Normal respiratory effort, no problems with respiration noted  Abdomen: Soft, gravid, appropriate for gestational age.  Pain/Pressure: Present     Pelvic: Cervical exam deferred        Extremities: Normal range of motion.  Edema: Trace  Mental Status: Normal mood and affect. Normal behavior. Normal judgment and thought content.   Assessment and Plan:  Pregnancy: G1P0 at [redacted]w[redacted]d 1. Diet controlled gestational diabetes mellitus (GDM) in third trimester No book today, FBS 80-89 2 hour pp is in the 80-90 except after dinner when it might be > 130. Discussed limiting carbs-->rice and tortillas at night. Also adding 10 min exercise following each meal.  2. Supervision of high risk pregnancy, antepartum Cultures next visit.  Preterm labor symptoms and general obstetric precautions including but not  limited to vaginal bleeding, contractions, leaking of fluid and fetal movement were reviewed in detail with the patient. Please refer to After Visit Summary for other counseling recommendations.   Return in about 1 week (around 04/28/2021) for [facility] needs 36 wk labs.  Future Appointments  Date Time Provider Department Center  04/28/2021  8:20 AM [facility]  04/28/2021  8:55 AM Anyanwu, Jethro Bastos, MD [facility]  04/29/2021  9:15 AM [facility] NURSE [facility]  04/29/2021  9:30 AM [facility] US3 [facility]    Reva Bores, MD

## 2021-04-21 NOTE — Patient Instructions (Signed)
Gestational Diabetes Mellitus, Diagnosis °Gestational diabetes mellitus, or gestational diabetes, is a form of diabetes that some women get during pregnancy. To control blood sugar (glucose) in the body, the pancreas makes a hormone called insulin. This hormone allows glucose to enter the cells in the body. The cells use glucose for energy. However, for some pregnant women, the pancreas may not make enough insulin or the body may not use available insulin properly. This leads to gestational diabetes. °Gestational diabetes lasts only a short time. It usually happens at weeks 24-28 of pregnancy and goes away after delivery. However, women who get gestational diabetes are more likely to: °Get it again if they become pregnant. °Develop type 2 diabetes in the future. °Gestational diabetes is not likely to cause problems if it is treated. If not controlled with treatment, it may cause problems during labor and delivery. Some problems can harm the baby and the mother. °What are the causes? °This condition occurs during pregnancy when the woman's body makes growth hormones that help the baby grow. Sometimes, these hormones: °Cause the pancreas to work harder than normal to make more insulin. Sometimes, the pancreas still cannot make enough insulin. °Cause insulin resistance. This makes it hard for the cells to use insulin properly. °Insulin resistance or lack of insulin causes extra glucose to build up in the blood instead of going into cells. This leads to high blood glucose (hyperglycemia). °What increases the risk? °This condition is more likely to develop in pregnant women who: °Are older than age 25 during pregnancy. °Have a family history of diabetes. °Are overweight. °Have had gestational diabetes in the past. °Have polycystic ovary syndrome (PCOS). °Are pregnant with twins or other multiples. °What are the signs or symptoms? °Common symptoms of this condition include: °Increased thirst. °Increased hunger. °Needing  to urinate more often. °Most women do not notice these symptoms because the symptoms are similar to other symptoms of pregnancy. °How is this diagnosed? °This condition may be diagnosed based on your blood glucose level. This may be checked: °After you fast for 8 hours or longer (fasting blood glucose test, or FBG). °Any time of day, no matter when you eat (random glucose test). °At weeks 24-28 of pregnancy, to check how your body responds to glucose (oral glucose tolerance test, or OGTT). °If you have risk factors, you may be screened for type 2 diabetes at your first health care visit during your pregnancy. °How is this treated? °Treatment for this condition depends on the stage of your pregnancy and any other medical conditions you may have. Treatment may include: °Eating a healthy diet and getting more physical activity. These are the most important ways to manage gestational diabetes. °Checking your blood glucose as often as told. °Taking insulin or other diabetes medicines every day. These medicines will be prescribed only if needed. °You may need to work with a diabetes specialist, or endocrinologist, on a treatment plan. The goal of treatment is to have the right blood glucose levels during your pregnancy. The levels are checked while you are fasting and after you eat. Your health care provider will tell you what those levels are. °Follow these instructions at home: °Learn about your condition °Learn as much as you can about your condition. Ask your health care provider: °How often should I check my blood glucose, and where do I get the equipment? °What diabetes medicines do I need, and when should I take them? °Do I need to meet with a certified diabetes care and education   specialist? What number can I call if I have questions? Where can I find a support group for people with gestational diabetes? General instructions Take over-the-counter and prescription medicines only as told by your health care  provider. Manage your weight gain during pregnancy. Your expected weight gain depends on your BMI (body mass index) before pregnancy. Drink enough fluid to keep your urine pale yellow. Carry a medical alert card or wear medical alert jewelry that says you have gestational diabetes. Keep all follow-up visits. This is important. Where to find more information American Diabetes Association (ADA): diabetes.org Association of Diabetes Care & Education Specialists (ADCES): diabeteseducator.org Centers for Disease Control and Prevention (CDC): TonerPromos.no American Pregnancy Association: americanpregnancy.org U.S. Department of Agriculture MyPlate: WrestlingReporter.dk Contact a health care provider if you: Have a blood glucose level at or above: 240 mg/dL (97.0 mmol/L). 200 mg/dL (26.3 mmol/L), and you have ketones in your urine. Ketones are made by the liver when a lack of glucose forces the body to use fat for energy. Have a fever. Have been sick for 2 days or more and are not getting better. Have either of these problems for more than 6 hours: Vomiting every time you eat or drink. Diarrhea. Get help right away if you: Become confused or cannot think clearly. Have trouble breathing. Have moderate or high ketone levels in your urine. Feel that your baby is moving around less than usual. Develop unusual discharge or bleeding from your vagina. Start having early (premature) contractions. Contractions may feel like a tightening in your lower abdomen. Have a severe headache. These symptoms may represent a serious problem that is an emergency. Do not wait to see if the symptoms will go away. Get medical help right away. Call your local emergency services (911 in the [place] Do not drive yourself to the hospital. Summary Gestational diabetes mellitus is a form of diabetes that some women get during pregnancy. It usually happens at weeks 24-28 of pregnancy and goes away after delivery. Treatment may include  eating a healthy diet and getting more physical activity. You may also be given insulin or other diabetes medicines. Contact a health care provider if your glucose levels reach 240 mg/dL (78.5 mmol/L) or higher. Get help right away if you become confused or cannot think clearly, or you feel that your baby is moving around less than usual. This information is not intended to replace advice given to you by your health care provider. Make sure you discuss any questions you have with your health care provider. Document Revised: 11/10/2019 Document Reviewed: 11/10/2019 Elsevier Patient Education  2022 [employer].

## 2021-04-28 ENCOUNTER — Other Ambulatory Visit: Payer: Medicaid Other

## 2021-04-28 ENCOUNTER — Other Ambulatory Visit: Payer: Self-pay

## 2021-04-28 ENCOUNTER — Other Ambulatory Visit (HOSPITAL_COMMUNITY)
Admission: RE | Admit: 2021-04-28 | Discharge: 2021-04-28 | Disposition: A | Discharge status: Home / Self Care | Payer: Medicaid Other | Source: Ambulatory Visit | Attending: Obstetrics & Gynecology | Admitting: Obstetrics & Gynecology

## 2021-04-28 ENCOUNTER — Ambulatory Visit (INDEPENDENT_AMBULATORY_CARE_PROVIDER_SITE_OTHER): Payer: Medicaid Other | Admitting: Obstetrics & Gynecology

## 2021-04-28 ENCOUNTER — Encounter: Payer: Self-pay | Admitting: Obstetrics & Gynecology

## 2021-04-28 VITALS — BP 138/88 | HR 99 | Wt 229.5 lb

## 2021-04-28 DIAGNOSIS — O0990 Supervision of high risk pregnancy, unspecified, unspecified trimester: Secondary | ICD-10-CM | POA: Insufficient documentation

## 2021-04-28 DIAGNOSIS — Z3A36 36 weeks gestation of pregnancy: Secondary | ICD-10-CM

## 2021-04-28 DIAGNOSIS — O99613 Diseases of the digestive system complicating pregnancy, third trimester: Secondary | ICD-10-CM | POA: Diagnosis not present

## 2021-04-28 DIAGNOSIS — O24410 Gestational diabetes mellitus in pregnancy, diet controlled: Secondary | ICD-10-CM

## 2021-04-28 DIAGNOSIS — K219 Gastro-esophageal reflux disease without esophagitis: Secondary | ICD-10-CM

## 2021-04-28 MED ORDER — PANTOPRAZOLE SODIUM 20 MG PO TBEC: 20.0000 mg | DELAYED_RELEASE_TABLET | Freq: Every day | ORAL | 1 refills | Status: DC

## 2021-04-28 NOTE — Progress Notes (Signed)
   PRENATAL VISIT NOTE  Subjective:  Cheryl Whitney is a 25 y.o. G1P0 at [redacted]w[redacted]d being seen today for ongoing prenatal care.  She is currently monitored for the following issues for this high-risk pregnancy and has Supervision of high risk pregnancy, antepartum; Gestational diabetes; and Sudden-onset sensorineural hearing loss on their problem list.  Patient reports heartburn.  Contractions: Irritability. Vag. Bleeding: None.  Movement: Present. Denies leaking of fluid.   The following portions of the patient's history were reviewed and updated as appropriate: allergies, current medications, past family history, past medical history, past social history, past surgical history and problem list.   Objective:   Vitals:   04/28/21 0943  BP: 138/88  Pulse: 99  Weight: 229 lb 8 oz (104.1 kg)    Fetal Status: Fetal Heart Rate (bpm): 159   Movement: Present  Presentation: Vertex  General:  Alert, oriented and cooperative. Patient is in no acute distress.  Skin: Skin is warm and dry. No rash noted.   Cardiovascular: Normal heart rate noted  Respiratory: Normal respiratory effort, no problems with respiration noted  Abdomen: Soft, gravid, appropriate for gestational age.  Pain/Pressure: Present     Pelvic: Cervical exam performed in the presence of a chaperone Dilation: Closed Effacement (%): Thick Station: Ballotable  Extremities: Normal range of motion.  Edema: Trace  Mental Status: Normal mood and affect. Normal behavior. Normal judgment and thought content.   Assessment and Plan:  Pregnancy: G1P0 at [redacted]w[redacted]d 1. Gastroesophageal reflux during pregnancy in third trimester, antepartum Protonix prescribed. - pantoprazole (PROTONIX) 20 MG tablet; Take 1 tablet (20 mg total) by mouth daily.  Dispense: 30 tablet; Refill: 1  2. Diet controlled gestational diabetes mellitus (GDM) in third trimester Blood sugars within range. Continue diet control.  Growth scan scheduled 04/29/21.  IOL  39-40 weeks.  3. [redacted] weeks gestation of pregnancy 4. Supervision of high risk pregnancy, antepartum Pelvic cultures done today. - GC/Chlamydia probe amp ([place])not at [facility] - Culture, beta strep (group b only) Preterm labor symptoms and general obstetric precautions including but not limited to vaginal bleeding, contractions, leaking of fluid and fetal movement were reviewed in detail with the patient. Please refer to After Visit Summary for other counseling recommendations.   Return in about 1 week (around 05/05/2021) for OFFICE OB VISIT (MD only).  Future Appointments  Date Time Provider Department Center  04/29/2021  9:15 AM [facility] NURSE [facility]  04/29/2021  9:30 AM [facility] US3 [facility]    Jaynie Collins, MD

## 2021-04-28 NOTE — Patient Instructions (Signed)
Return to office for any scheduled appointments. Call the office or go to the MAU at [facility] [place] if:  You begin to have strong, frequent contractions  Your water breaks.  Sometimes it is a big gush of fluid, sometimes it is just a trickle that keeps getting your panties wet or running down your legs  You have vaginal bleeding.  It is normal to have a small amount of spotting if your cervix was checked.   You do not feel your baby moving like normal.  If you do not, get something to eat and drink and lay down and focus on feeling your baby move.   If your baby is still not moving like normal, you should call the office or go to MAU.  Any other obstetric concerns.   

## 2021-04-29 ENCOUNTER — Ambulatory Visit: Payer: Medicaid Other | Admitting: *Deleted

## 2021-04-29 ENCOUNTER — Ambulatory Visit: Payer: Medicaid Other | Attending: Obstetrics

## 2021-04-29 ENCOUNTER — Encounter: Payer: Self-pay | Admitting: *Deleted

## 2021-04-29 ENCOUNTER — Ambulatory Visit: Payer: Medicaid Other | Attending: Obstetrics and Gynecology | Admitting: Obstetrics and Gynecology

## 2021-04-29 VITALS — BP 134/92 | HR 105

## 2021-04-29 DIAGNOSIS — Z363 Encounter for antenatal screening for malformations: Secondary | ICD-10-CM | POA: Insufficient documentation

## 2021-04-29 DIAGNOSIS — Z3A36 36 weeks gestation of pregnancy: Secondary | ICD-10-CM | POA: Diagnosis not present

## 2021-04-29 DIAGNOSIS — O0990 Supervision of high risk pregnancy, unspecified, unspecified trimester: Secondary | ICD-10-CM | POA: Insufficient documentation

## 2021-04-29 DIAGNOSIS — O99213 Obesity complicating pregnancy, third trimester: Secondary | ICD-10-CM | POA: Diagnosis not present

## 2021-04-29 DIAGNOSIS — E669 Obesity, unspecified: Secondary | ICD-10-CM

## 2021-04-29 DIAGNOSIS — O24410 Gestational diabetes mellitus in pregnancy, diet controlled: Secondary | ICD-10-CM | POA: Diagnosis present

## 2021-04-29 DIAGNOSIS — Z6830 Body mass index (BMI) 30.0-30.9, adult: Secondary | ICD-10-CM

## 2021-04-29 LAB — GC/CHLAMYDIA PROBE AMP (~~LOC~~) NOT AT ARMC
Chlamydia: NEGATIVE
Comment: NEGATIVE
Comment: NORMAL
Neisseria Gonorrhea: NEGATIVE

## 2021-04-29 NOTE — Progress Notes (Signed)
Maternal-Fetal Medicine   Name: Cheryl Whitney DOB: September 05, 1995 MRN: [id number] Referring Provider: Jaynie Collins, MD   I had the pleasure of seeing [name] [facility] at [facility].  She is here for follow-up of fetal growth assessment.  Patient has gestational diabetes that is reportedly well controlled on diet.  She reports occasional postprandial levels are slightly higher.  Patient had prenatal visit yesterday and her blood pressure was 138/88 mmHg.  Yesterday, she had headache, visual disturbances ("floaters") and epigastric pain.  All the symptoms have resolved today.  Blood pressures today at her office were 138/94 and 134/92 mmHg.  Ultrasound Fetal growth is appropriate for gestational age.  The estimated fetal weight is the 95th percentile.  Amniotic fluid is normal good fetal activity seen.  I counseled the patient on increased blood pressure readings that need to be followed closely to make a diagnosis of gestational hypertension.  Patient has blood pressure cuff at home.  I discussed blood pressure parameters that are consistent with hypertension, and recommended that she contact your office or come to maternity admissions unit if her systolic blood pressures are consistently above 140 mmHg and/or diastolic blood pressures above 90 mmHg.  I explained the possibility of diagnosis of gestational hypertension/preeclampsia which are associated with increased risk of maternal complications. Timing of delivery: If gestational hypertension is confirmed, we recommend delivery at [redacted] weeks gestation.  Patient will be checking her blood pressures at least twice daily and will call your office or report to the MAU if hypertension is suspected.  Recommendations Delivery at [redacted] weeks gestation if gestational hypertension/preeclampsia is diagnosed.  Thank you for consultation.  If you have any questions or concerns, please contact me the [facility].   Consultation including face-to-face (more than 50%) counseling 20 minutes.

## 2021-05-02 ENCOUNTER — Other Ambulatory Visit: Payer: Self-pay

## 2021-05-02 ENCOUNTER — Inpatient Hospital Stay (HOSPITAL_COMMUNITY)
Admission: AD | Admit: 2021-05-02 | Discharge: 2021-05-06 | DRG: 805 | Disposition: A | Discharge status: Home / Self Care | Payer: Medicaid Other | Attending: Obstetrics & Gynecology | Admitting: Obstetrics & Gynecology

## 2021-05-02 ENCOUNTER — Encounter (HOSPITAL_COMMUNITY): Payer: Self-pay | Admitting: Family Medicine

## 2021-05-02 DIAGNOSIS — Z20822 Contact with and (suspected) exposure to covid-19: Secondary | ICD-10-CM | POA: Diagnosis present

## 2021-05-02 DIAGNOSIS — O411230 Chorioamnionitis, third trimester, not applicable or unspecified: Secondary | ICD-10-CM | POA: Diagnosis present

## 2021-05-02 DIAGNOSIS — O1490 Unspecified pre-eclampsia, unspecified trimester: Secondary | ICD-10-CM | POA: Diagnosis present

## 2021-05-02 DIAGNOSIS — O1414 Severe pre-eclampsia complicating childbirth: Principal | ICD-10-CM | POA: Diagnosis present

## 2021-05-02 DIAGNOSIS — Z3A36 36 weeks gestation of pregnancy: Secondary | ICD-10-CM

## 2021-05-02 DIAGNOSIS — O1413 Severe pre-eclampsia, third trimester: Secondary | ICD-10-CM

## 2021-05-02 DIAGNOSIS — N179 Acute kidney failure, unspecified: Secondary | ICD-10-CM | POA: Diagnosis present

## 2021-05-02 DIAGNOSIS — D62 Acute posthemorrhagic anemia: Secondary | ICD-10-CM

## 2021-05-02 DIAGNOSIS — O99892 Other specified diseases and conditions complicating childbirth: Secondary | ICD-10-CM | POA: Diagnosis present

## 2021-05-02 DIAGNOSIS — Z8632 Personal history of gestational diabetes: Secondary | ICD-10-CM | POA: Diagnosis present

## 2021-05-02 DIAGNOSIS — O24410 Gestational diabetes mellitus in pregnancy, diet controlled: Secondary | ICD-10-CM

## 2021-05-02 DIAGNOSIS — H9041 Sensorineural hearing loss, unilateral, right ear, with unrestricted hearing on the contralateral side: Secondary | ICD-10-CM | POA: Diagnosis present

## 2021-05-02 DIAGNOSIS — O9081 Anemia of the puerperium: Secondary | ICD-10-CM | POA: Diagnosis not present

## 2021-05-02 DIAGNOSIS — O24419 Gestational diabetes mellitus in pregnancy, unspecified control: Secondary | ICD-10-CM | POA: Diagnosis present

## 2021-05-02 DIAGNOSIS — O26833 Pregnancy related renal disease, third trimester: Secondary | ICD-10-CM | POA: Diagnosis present

## 2021-05-02 DIAGNOSIS — O0990 Supervision of high risk pregnancy, unspecified, unspecified trimester: Secondary | ICD-10-CM

## 2021-05-02 DIAGNOSIS — O24420 Gestational diabetes mellitus in childbirth, diet controlled: Secondary | ICD-10-CM | POA: Diagnosis present

## 2021-05-02 DIAGNOSIS — O99013 Anemia complicating pregnancy, third trimester: Secondary | ICD-10-CM | POA: Diagnosis present

## 2021-05-02 DIAGNOSIS — O411290 Chorioamnionitis, unspecified trimester, not applicable or unspecified: Secondary | ICD-10-CM

## 2021-05-02 LAB — RESP PANEL BY RT-PCR (FLU A&B, COVID) ARPGX2
Influenza A by PCR: NEGATIVE
Influenza B by PCR: NEGATIVE
SARS Coronavirus 2 by RT PCR: NEGATIVE

## 2021-05-02 LAB — COMPREHENSIVE METABOLIC PANEL
ALT: 44 U/L (ref 0–44)
AST: 54 U/L — ABNORMAL HIGH (ref 15–41)
Albumin: 2.2 g/dL — ABNORMAL LOW (ref 3.5–5.0)
Alkaline Phosphatase: 225 U/L — ABNORMAL HIGH (ref 38–126)
Anion gap: 6 (ref 5–15)
BUN: 15 mg/dL (ref 6–20)
CO2: 21 mmol/L — ABNORMAL LOW (ref 22–32)
Calcium: 8.5 mg/dL — ABNORMAL LOW (ref 8.9–10.3)
Chloride: 108 mmol/L (ref 98–111)
Creatinine, Ser: 0.81 mg/dL (ref 0.44–1.00)
GFR, Estimated: >60 mL/min (ref >60)
Glucose, Bld: 78 mg/dL (ref 70–99)
Potassium: 4.4 mmol/L (ref 3.5–5.1)
Sodium: 135 mmol/L (ref 135–145)
Total Bilirubin: <0.1 mg/dL — ABNORMAL LOW (ref 0.3–1.2)
Total Protein: 5.6 g/dL — ABNORMAL LOW (ref 6.5–8.1)

## 2021-05-02 LAB — CBC
HCT: 33.9 % — ABNORMAL LOW (ref 36.0–46.0)
Hemoglobin: 10.4 g/dL — ABNORMAL LOW (ref 12.0–15.0)
MCH: 25.3 pg — ABNORMAL LOW (ref 26.0–34.0)
MCHC: 30.7 g/dL (ref 30.0–36.0)
MCV: 82.5 fL (ref 80.0–100.0)
Platelets: 331 10*3/uL (ref 150–400)
RBC: 4.11 MIL/uL (ref 3.87–5.11)
RDW: 14.5 % (ref 11.5–15.5)
WBC: 10.1 10*3/uL (ref 4.0–10.5)
nRBC: 0 % (ref 0.0–0.2)

## 2021-05-02 LAB — RPR: RPR Ser Ql: NONREACTIVE

## 2021-05-02 LAB — TYPE AND SCREEN
ABO/RH(D): O POS
Antibody Screen: NEGATIVE

## 2021-05-02 LAB — PROTEIN / CREATININE RATIO, URINE
Creatinine, Urine: 94.8 mg/dL
Protein Creatinine Ratio: 8.74 mg/mg{Cre} — ABNORMAL HIGH (ref 0.00–0.15)
Total Protein, Urine: 829 mg/dL

## 2021-05-02 LAB — GLUCOSE, CAPILLARY
Glucose-Capillary: 73 mg/dL (ref 70–99)
Glucose-Capillary: 83 mg/dL (ref 70–99)
Glucose-Capillary: 136 mg/dL — ABNORMAL HIGH (ref 70–99)
Glucose-Capillary: 111 mg/dL — ABNORMAL HIGH (ref 70–99)

## 2021-05-02 LAB — CULTURE, BETA STREP (GROUP B ONLY): Strep Gp B Culture: NEGATIVE

## 2021-05-02 MED ORDER — OXYCODONE-ACETAMINOPHEN 5-325 MG PO TABS: 1.0000 | ORAL_TABLET | ORAL | Status: DC | PRN

## 2021-05-02 MED ORDER — LABETALOL HCL 5 MG/ML IV SOLN
20.0000 mg | INTRAVENOUS | Status: DC | PRN
  Administered 2021-05-02 – 2021-05-03 (×2): 20 mg via INTRAVENOUS
  Filled 2021-05-02 (×2): qty 4

## 2021-05-02 MED ORDER — HYDRALAZINE HCL 20 MG/ML IJ SOLN: 10.0000 mg | INTRAMUSCULAR | Status: DC | PRN

## 2021-05-02 MED ORDER — OXYTOCIN-SODIUM CHLORIDE 30-0.9 UT/500ML-% IV SOLN
1.0000 m[IU]/min | INTRAVENOUS | Status: DC
  Administered 2021-05-02: 2 m[IU]/min via INTRAVENOUS
  Administered 2021-05-04: 30 m[IU]/min via INTRAVENOUS
  Filled 2021-05-02: qty 500

## 2021-05-02 MED ORDER — LABETALOL HCL 5 MG/ML IV SOLN: 40.0000 mg | INTRAVENOUS | Status: DC | PRN

## 2021-05-02 MED ORDER — LACTATED RINGERS IV SOLN: INTRAVENOUS | Status: DC

## 2021-05-02 MED ORDER — MAGNESIUM SULFATE 40 GM/1000ML IV SOLN
1.0000 g/h | INTRAVENOUS | Status: DC
  Administered 2021-05-02 – 2021-05-04 (×4): 2 g/h via INTRAVENOUS
  Filled 2021-05-02 (×4): qty 1000

## 2021-05-02 MED ORDER — LACTATED RINGERS IV SOLN: 500.0000 mL | INTRAVENOUS | Status: DC | PRN

## 2021-05-02 MED ORDER — OXYTOCIN-SODIUM CHLORIDE 30-0.9 UT/500ML-% IV SOLN
2.5000 [IU]/h | INTRAVENOUS | Status: DC
  Administered 2021-05-04: 2.5 [IU]/h via INTRAVENOUS
  Filled 2021-05-02: qty 500

## 2021-05-02 MED ORDER — MISOPROSTOL 50MCG HALF TABLET
50.0000 ug | ORAL_TABLET | ORAL | Status: DC | PRN
  Administered 2021-05-02 (×2): 50 ug via ORAL
  Filled 2021-05-02 (×2): qty 1

## 2021-05-02 MED ORDER — ACETAMINOPHEN 500 MG PO TABS
1000.0000 mg | ORAL_TABLET | Freq: Once | ORAL | Status: AC
  Administered 2021-05-02: 1000 mg via ORAL
  Filled 2021-05-02: qty 2

## 2021-05-02 MED ORDER — TERBUTALINE SULFATE 1 MG/ML IJ SOLN: 0.2500 mg | Freq: Once | INTRAMUSCULAR | Status: DC | PRN

## 2021-05-02 MED ORDER — MAGNESIUM SULFATE BOLUS VIA INFUSION
4.0000 g | Freq: Once | INTRAVENOUS | Status: AC
  Administered 2021-05-02: 4 g via INTRAVENOUS
  Filled 2021-05-02: qty 1000

## 2021-05-02 MED ORDER — LIDOCAINE HCL (PF) 1 % IJ SOLN: 30.0000 mL | INTRAMUSCULAR | Status: DC | PRN

## 2021-05-02 MED ORDER — ONDANSETRON HCL 4 MG/2ML IJ SOLN: 4.0000 mg | Freq: Four times a day (QID) | INTRAMUSCULAR | Status: DC | PRN

## 2021-05-02 MED ORDER — OXYCODONE-ACETAMINOPHEN 5-325 MG PO TABS: 2.0000 | ORAL_TABLET | ORAL | Status: DC | PRN

## 2021-05-02 MED ORDER — LABETALOL HCL 5 MG/ML IV SOLN: 80.0000 mg | INTRAVENOUS | Status: DC | PRN

## 2021-05-02 MED ORDER — ACETAMINOPHEN 325 MG PO TABS
650.0000 mg | ORAL_TABLET | ORAL | Status: DC | PRN
  Administered 2021-05-02 – 2021-05-03 (×4): 650 mg via ORAL
  Filled 2021-05-02 (×4): qty 2

## 2021-05-02 MED ORDER — OXYTOCIN BOLUS FROM INFUSION
333.0000 mL | Freq: Once | INTRAVENOUS | Status: AC
  Administered 2021-05-04: 333 mL via INTRAVENOUS

## 2021-05-02 MED ORDER — SOD CITRATE-CITRIC ACID 500-334 MG/5ML PO SOLN: 30.0000 mL | ORAL | Status: DC | PRN

## 2021-05-02 NOTE — H&P (Signed)
OBSTETRIC ADMISSION HISTORY AND PHYSICAL  Cheryl Whitney is a 25 y.o. female G1P0 with IUP at [redacted]w[redacted]d by 6 weeks ultrasound presenting for IOL for pre-eclampsia with severe features. She presented to MAU tonight with elevated BP, headache, and swelling in feet. Patient reports she was at MFM earlier this week and was told BP was high and that if she developed any symptoms she may need to be delivered. Patient reports a headache since 9-10pm that she rates 8/10. She took her BP and it was 182/120. She has not taken to help relieve headache. She denies RUQ pain or vision changes tonight although reports she has had some intermittent floaters in vision for the past few days and had some epigastric pain yesterday. Reports irregular contractions, back pain and pelvic pressure. Denies vaginal bleeding or leaking fluid. Endorses active fetal movement. She plans on breast/bottle feeding. She request postplacental IUD for birth control. She received her prenatal care at  [facility]    Dating: By 6 weeks ultrasound --->  Estimated Date of Delivery: 05/26/21  Sono:    @[redacted]w[redacted]d , CWD, normal anatomy, cephalic presentation, posterior placenta, 2205gg, 88% EFW  @[redacted]w[redacted]d , CWD, normal anatomy, cephalic presentation, posterior placenta, 3429g, 95%   Prenatal History/Complications:  - A1GDM - GBS negative - Sensorineural hearing loss  Past Medical History: Past Medical History:  Diagnosis Date   Gestational diabetes    Hearing loss in right ear    Medical history non-contributory    Past Surgical History: Past Surgical History:  Procedure Laterality Date   NO PAST SURGERIES     Obstetrical History: OB History     Gravida  1   Para      Term      Preterm      AB      Living         SAB      IAB      Ectopic      Multiple      Live Births             Social History Social History   Socioeconomic History   Marital status: Significant Other    Spouse name: Not on file    Number of children: Not on file   Years of education: Not on file   Highest education level: Not on file  Occupational History   Not on file  Tobacco Use   Smoking status: Never   Smokeless tobacco: Never  Vaping Use   Vaping Use: Never used  Substance and Sexual Activity   Alcohol use: Not Currently   Drug use: Never   Sexual activity: Yes    Birth control/protection: None  Other Topics Concern   Not on file  Social History Narrative   Not on file   Social Determinants of Health   Financial Resource Strain: Not on file  Food Insecurity: Not on file  Transportation Needs: Not on file  Physical Activity: Not on file  Stress: Not on file  Social Connections: Not on file   Family History: Family History  Problem Relation Age of Onset   Healthy Mother    Healthy Father    Allergies: No Known Allergies  Medications Prior to Admission  Medication Sig Dispense Refill Last Dose   acetaminophen (TYLENOL) 500 MG tablet Take 2 tablets (1,000 mg total) by mouth every 8 (eight) hours as needed for moderate pain. 100 tablet 2 Past Week   Prenatal Vit-Fe Fumarate-FA (PRENATAL VITAMINS PO) Take 1 tablet  by mouth daily.   05/01/2021   pantoprazole (PROTONIX) 20 MG tablet Take 1 tablet (20 mg total) by mouth daily. 30 tablet 1    Review of Systems   All systems reviewed and negative except as stated in HPI  Blood pressure 121/86, pulse 88, temperature 97.9 F (36.6 C), temperature source Oral, resp. rate 17, height 5\' 3"  (1.6 m), weight 105.5 kg, last menstrual period 08/10/2020, SpO2 99 %. General appearance: alert and no distress Lungs: effort normal Heart: regular  Abdomen: soft, non-tender, gravid Extremities: Homans sign is negative, no sign of DVT DTR's +1, no clonus Presentation: cephalic Fetal monitoring: 135 bpm, moderate variability, +15x15 accels, no decels Uterine activity: quiet    Prenatal labs: ABO, Rh: O/Positive/-- (05/09 0000) Antibody: Negative (05/09  0000) Rubella: Immune, Immune (05/09 0000) RPR: Nonreactive (09/23 0000)  HBsAg: Negative (05/09 0000)  HIV: Non Reactive (04/16 1002)  GBS: Negative/-- (11/10 1005)  1 hr Glucola: 200 Genetic screening: negative quad, negative CF Anatomy 08-01-2004: normal  Prenatal Transfer Tool  Maternal Diabetes: Yes:  Diabetes Type:  Diet controlled Genetic Screening: Normal Maternal Ultrasounds/Referrals: Normal Fetal Ultrasounds or other Referrals:  None Maternal Substance Abuse:  No Significant Maternal Medications:  None Significant Maternal Lab Results: Group B Strep negative  Results for orders placed or performed during the hospital encounter of 05/02/21 (from the past 24 hour(s))  Protein / creatinine ratio, urine   Collection Time: 05/02/21  1:07 AM  Result Value Ref Range   Creatinine, Urine 94.80 mg/dL   Total Protein, Urine 829 mg/dL   Protein Creatinine Ratio 8.74 (H) 0.00 - 0.15 mg/mg[Cre]  CBC   Collection Time: 05/02/21  1:21 AM  Result Value Ref Range   WBC 10.1 4.0 - 10.5 K/uL   RBC 4.11 3.87 - 5.11 MIL/uL   Hemoglobin 10.4 (L) 12.0 - 15.0 g/dL   HCT 05/04/21 (L) 31.5 - 40.0 %   MCV 82.5 80.0 - 100.0 fL   MCH 25.3 (L) 26.0 - 34.0 pg   MCHC 30.7 30.0 - 36.0 g/dL   RDW 86.7 61.9 - 50.9 %   Platelets 331 150 - 400 K/uL   nRBC 0.0 0.0 - 0.2 %  Comprehensive metabolic panel   Collection Time: 05/02/21  1:21 AM  Result Value Ref Range   Sodium 135 135 - 145 mmol/L   Potassium 4.4 3.5 - 5.1 mmol/L   Chloride 108 98 - 111 mmol/L   CO2 21 (L) 22 - 32 mmol/L   Glucose, Bld 78 70 - 99 mg/dL   BUN 15 6 - 20 mg/dL   Creatinine, Ser 05/04/21 0.44 - 1.00 mg/dL   Calcium 8.5 (L) 8.9 - 10.3 mg/dL   Total Protein 5.6 (L) 6.5 - 8.1 g/dL   Albumin 2.2 (L) 3.5 - 5.0 g/dL   AST 54 (H) 15 - 41 U/L   ALT 44 0 - 44 U/L   Alkaline Phosphatase 225 (H) 38 - 126 U/L   Total Bilirubin <0.1 (L) 0.3 - 1.2 mg/dL   GFR, Estimated 7.12 >45 mL/min   Anion gap 6 5 - 15    Patient Active Problem List    Diagnosis Date Noted   Supervision of high risk pregnancy, antepartum 04/06/2021   Gestational diabetes 04/06/2021   Sudden-onset sensorineural hearing loss 12/14/2020    Assessment/Plan:  Cheryl Whitney is a 25 y.o. G1P0 at [redacted]w[redacted]d here for IOL for pre-e with severe features  #Labor: plan for cytotec/FB placement. Pitocin and AROM when  appropriate #Pre-e: mag 4g bolus followed by 2g/hr; S/p 20mg  IV labetalol, BP's stable, UPCR 8.74, elevated AST, patient asymptomatic at this time, continue to monitor #A1GDM: Q4h CBG's, EFW 95% #Pain: epidural prn #FWB: Cat 1 #ID: GBS neg #MOF: both #MOC: postplacental IUD #Circ: n/a     , CNM  05/02/2021, 3:51 AM

## 2021-05-02 NOTE — Progress Notes (Signed)
Labor Progress Note Cheryl Whitney is a 25 y.o. G1P0 at [redacted]w[redacted]d presented for IOL for pre-eclampsia with severe features.  S: Resting comfortably in bed.   O:  BP (!) 152/102   Pulse 81   Temp 97.8 F (36.6 C) (Oral)   Resp 18   Ht 5\' 3"  (1.6 m)   Wt 105.5 kg   LMP 08/10/2020   SpO2 99%   BMI 41.20 kg/m  EFM: baseline 120/moderate variability/accelerations present, no decelerations  CVE: Dilation: 1 Effacement (%): 50 Station: -2 Exam by:: M.Bhambri, CNM   A&P: 25 y.o. G1P0 [redacted]w[redacted]d admitted for IOL for pre-eclampsia with severe features. #Labor: Progressing well. Foley balloon in place, has received cytotec x1. Discussed induction process and answered more questions. #Pain: Slightly uncomfortable with occasional "cramps" but overall comfortable. Pain control PRN. #FWB: Cat I #GBS negative  #Pre-E w/ Severe Features: BP continues to be elevated but not in severe range, with systolic 140s-50s and diastolic 90s-102. Continue MgSO4 and Labetalol protocol prn.   [redacted]w[redacted]d, MD 1:38 PM

## 2021-05-02 NOTE — MAU Provider Note (Signed)
History     CSN: [id number]  Arrival date and time: 05/02/21 0007  None     Chief Complaint  Patient presents with   Headache   Leg Swelling   Hypertension    BP 182/105   HPI Cheryl Whitney is a 25 y.o. 0000000 at [redacted]w[redacted]d complicated by [id number] who presents to MAU for elevated BP, headache, and swelling in feet. Patient reports she was at [facility]FM earlier this week and was told BP was high and that if she developed any symptoms she may need to be delivered. Patient reports a headache since 9-10pm that she rates 8/10. She took her BP and it was 182/120. She has not taken to help relieve headache. She denies RUQ pain or vision changes tonight although reports she has had some intermittent floaters in vision for the past few days and had some epigastric pain yesterday. Reports irregular contractions, back pain and pelvic pressure. Denies vaginal bleeding or leaking fluid. Endorses active fetal movement.   OB History     Gravida  1   Para      Term      Preterm      AB      Living         SAB      IAB      Ectopic      Multiple      Live Births              Past Medical History:  Diagnosis Date   Gestational diabetes    Hearing loss in right ear    Medical history non-contributory     Past Surgical History:  Procedure Laterality Date   NO PAST SURGERIES      Family History  Problem Relation Age of Onset   Healthy Mother    Healthy Father     Social History   Tobacco Use   Smoking status: Never   Smokeless tobacco: Never  Vaping Use   Vaping Use: Never used  Substance Use Topics   Alcohol use: Not Currently   Drug use: Never    Allergies: No Known Allergies  Medications Prior to Admission  Medication Sig Dispense Refill Last Dose   acetaminophen (TYLENOL) 500 MG tablet Take 2 tablets (1,000 mg total) by mouth every 8 (eight) hours as needed for moderate pain. 100 tablet 2 Past Week   Prenatal Vit-Fe Fumarate-FA (PRENATAL VITAMINS PO) Take 1  tablet by mouth daily.   05/01/2021   pantoprazole (PROTONIX) 20 MG tablet Take 1 tablet (20 mg total) by mouth daily. 30 tablet 1     Review of Systems  Constitutional: Negative.   Eyes:  Positive for visual disturbance.  Respiratory: Negative.    Cardiovascular: Negative.   Gastrointestinal:  Positive for abdominal pain.  Genitourinary:  Positive for pelvic pain. Negative for dysuria, vaginal bleeding and vaginal discharge.  Musculoskeletal:  Positive for back pain.  Neurological:  Positive for headaches.   Physical Exam   Patient Vitals for the past 24 hrs:  BP Temp Temp src Pulse Resp SpO2 Height Weight  05/02/21 0330 131/61 -- -- 89 -- -- -- --  05/02/21 0315 (!) 135/96 -- -- 88 -- -- -- --  05/02/21 0300 124/84 -- -- 94 -- 99 % -- --  05/02/21 0245 129/84 -- -- 80 -- -- -- --  05/02/21 0232 136/88 -- -- (!) 103 -- 99 % -- --  05/02/21 0215 (!) 157/114 -- -- (!) 104 -- -- -- --  05/02/21 0200 (!) 160/111 -- -- (!) 106 -- 99 % -- --  05/02/21 0145 (!) 156/99 -- -- 96 -- 99 % -- --  05/02/21 0130 (!) 147/109 -- -- (!) 110 -- 99 % -- --  05/02/21 0115 (!) 156/107 -- -- (!) 120 -- -- -- --  05/02/21 0039 (!) 152/98 -- -- -- -- -- -- --  05/02/21 0034 (!) 150/111 97.9 F (36.6 C) Oral 93 17 100 % 5\' 3"  (1.6 m) 105.5 kg    Physical Exam Vitals and nursing note reviewed.  Constitutional:      General: She is not in acute distress.    Appearance: She is obese.  Eyes:     Pupils: Pupils are equal, round, and reactive to light.  Cardiovascular:     Rate and Rhythm: Normal rate.  Pulmonary:     Effort: Pulmonary effort is normal.  Abdominal:     Palpations: Abdomen is soft.     Tenderness: There is no abdominal tenderness.     Comments: gravid  Genitourinary:    Comments: Cervix: FT/50/ballotable Musculoskeletal:        General: Normal range of motion.     Right lower leg: Edema present.     Left lower leg: Edema present.     Comments: +2 pitting edema bilateral lower  extremities, no clonus, +1 reflexes   Skin:    General: Skin is warm and dry.  Neurological:     General: No focal deficit present.     Mental Status: She is alert and oriented to person, place, and time.  Psychiatric:        Mood and Affect: Mood normal.        Behavior: Behavior normal.        Thought Content: Thought content normal.        Judgment: Judgment normal.   NST: FHT: 135 bpm, moderate variability, +15x15 accels, no decels Toco: quiet  MAU Course  Procedures  MDM CBC unremarkable, CMP AST 54/ALT 44, UPCR pending Tylenol 1000mg  PO, headache completely resolved Serial BP's Patient had 2 severe range BP's, IV was started and labetalol protocol was initiated  NST reactive and reassuring Toco quiet D/w Dr. and will admit to L&D for IOL for pre-eclampsia with severe features   Assessment and Plan  Pre-eclampsia with severe features [redacted] weeks gestation  - Admit to L&D for IOL    , CNM 05/02/2021, 3:34 AM

## 2021-05-02 NOTE — Progress Notes (Signed)
Labor Progress Note Teva Bronkema is a 25 y.o. G1P0 at [redacted]w[redacted]d presented for IOL for PEC w/SF. Awaiting bed on LD.  S:  Resting comfortably, no c/o  O:  BP 114/66 (BP Location: Left Arm)   Pulse 93   Temp 97.6 F (36.4 C) (Oral)   Resp 19   Ht 5\' 3"  (1.6 m)   Wt 105.5 kg   LMP 08/10/2020   SpO2 95%   BMI 41.20 kg/m  EFM: baseline 115 bpm/ mod variability/ + accels/ no decels  Toco/IUPC: rare SVE: 1/50/-2, vtx Vtx by BSUS  A/P: 25 y.o. G1P0 [redacted]w[redacted]d  1. Labor: latent 2. FWB: Cat I 3. Pain: analgesia/anesthesia/NO prn 4. PEC: stable  Consented for FB placement, placed w/o difficulty, tolerated well. Plan to start Cytotec once in labor room. Continue MgSO4, Labetalol protocol prn. Anticipate SVD.  [redacted]w[redacted]d, CNM 9:31 AM

## 2021-05-02 NOTE — Progress Notes (Signed)
Labor Progress Note Rateel Beldin is a 25 y.o. G1P0 at [redacted]w[redacted]d presented for IOL for PEC w/SF  S:  Comfortable, no c/o.   O:  BP (!) 149/101   Pulse 97   Temp 98.1 F (36.7 C) (Oral)   Resp 18   Ht 5\' 3"  (1.6 m)   Wt 105.5 kg   LMP 08/10/2020   SpO2 99%   BMI 41.20 kg/m  EFM: baseline 125 bpm/ mod variability/ + accels/ no decels  Toco/IUPC: rare SVE: deferred   A/P: 25 y.o. G1P0 [redacted]w[redacted]d  1. Labor: latent 2. FWB: Cat I 3. Pain: analgesia/anesthesia/NO prn 4. PEC: stable 5. GDM: stable  FB out @1812 . Start Pitocin 4 hrs after last Cytotec dose. Anticipate labor progress and SVD.  [redacted]w[redacted]d, CNM 6:34 PM

## 2021-05-02 NOTE — MAU Note (Signed)
Cheryl Whitney is a 25 y.o. at [redacted]w[redacted]d here in MAU reporting:2000 tonight headache began and lower to mid back describes pain as sharp/thumping pain. Pt reports also that feet are swollen all the time without relief even with rest. EDC: 05/26/2021  Vitals:   05/02/21 0034  BP: (!) 150/111  Pulse: 93  Resp: 17  Temp: 97.9 F (36.6 C)  SpO2: 100%     FHT:155bpm

## 2021-05-03 LAB — GLUCOSE, CAPILLARY
Glucose-Capillary: 111 mg/dL — ABNORMAL HIGH (ref 70–99)
Glucose-Capillary: 81 mg/dL (ref 70–99)
Glucose-Capillary: 82 mg/dL (ref 70–99)
Glucose-Capillary: 91 mg/dL (ref 70–99)
Glucose-Capillary: 91 mg/dL (ref 70–99)
Glucose-Capillary: 93 mg/dL (ref 70–99)

## 2021-05-03 LAB — CBC WITH DIFFERENTIAL/PLATELET
Abs Immature Granulocytes: 0.07 10*3/uL (ref 0.00–0.07)
Basophils Absolute: 0 10*3/uL (ref 0.0–0.1)
Basophils Relative: 0 %
Eosinophils Absolute: 0 10*3/uL (ref 0.0–0.5)
Eosinophils Relative: 0 %
HCT: 33.7 % — ABNORMAL LOW (ref 36.0–46.0)
Hemoglobin: 10.5 g/dL — ABNORMAL LOW (ref 12.0–15.0)
Immature Granulocytes: 1 %
Lymphocytes Relative: 8 %
Lymphs Abs: 1.2 10*3/uL (ref 0.7–4.0)
MCH: 25.5 pg — ABNORMAL LOW (ref 26.0–34.0)
MCHC: 31.2 g/dL (ref 30.0–36.0)
MCV: 81.8 fL (ref 80.0–100.0)
Monocytes Absolute: 1.1 10*3/uL — ABNORMAL HIGH (ref 0.1–1.0)
Monocytes Relative: 7 %
Neutro Abs: 13.1 10*3/uL — ABNORMAL HIGH (ref 1.7–7.7)
Neutrophils Relative %: 84 %
Platelets: 301 10*3/uL (ref 150–400)
RBC: 4.12 MIL/uL (ref 3.87–5.11)
RDW: 14.9 % (ref 11.5–15.5)
WBC: 15.5 10*3/uL — ABNORMAL HIGH (ref 4.0–10.5)
nRBC: 0 % (ref 0.0–0.2)

## 2021-05-03 LAB — CBC
HCT: 33.9 % — ABNORMAL LOW (ref 36.0–46.0)
Hemoglobin: 10.4 g/dL — ABNORMAL LOW (ref 12.0–15.0)
MCH: 25.1 pg — ABNORMAL LOW (ref 26.0–34.0)
MCHC: 30.7 g/dL (ref 30.0–36.0)
MCV: 81.9 fL (ref 80.0–100.0)
Platelets: 326 10*3/uL (ref 150–400)
RBC: 4.14 MIL/uL (ref 3.87–5.11)
RDW: 14.8 % (ref 11.5–15.5)
WBC: 14.6 10*3/uL — ABNORMAL HIGH (ref 4.0–10.5)
nRBC: 0 % (ref 0.0–0.2)

## 2021-05-03 LAB — COMPREHENSIVE METABOLIC PANEL
ALT: 38 U/L (ref 0–44)
ALT: 36 U/L (ref 0–44)
AST: 44 U/L — ABNORMAL HIGH (ref 15–41)
AST: 40 U/L (ref 15–41)
Albumin: 2.2 g/dL — ABNORMAL LOW (ref 3.5–5.0)
Albumin: 2.2 g/dL — ABNORMAL LOW (ref 3.5–5.0)
Alkaline Phosphatase: 239 U/L — ABNORMAL HIGH (ref 38–126)
Alkaline Phosphatase: 237 U/L — ABNORMAL HIGH (ref 38–126)
Anion gap: 9 (ref 5–15)
Anion gap: 9 (ref 5–15)
BUN: 7 mg/dL (ref 6–20)
BUN: 6 mg/dL (ref 6–20)
CO2: 18 mmol/L — ABNORMAL LOW (ref 22–32)
CO2: 20 mmol/L — ABNORMAL LOW (ref 22–32)
Calcium: 7.4 mg/dL — ABNORMAL LOW (ref 8.9–10.3)
Calcium: 7.5 mg/dL — ABNORMAL LOW (ref 8.9–10.3)
Chloride: 99 mmol/L (ref 98–111)
Chloride: 100 mmol/L (ref 98–111)
Creatinine, Ser: 0.83 mg/dL (ref 0.44–1.00)
Creatinine, Ser: 0.85 mg/dL (ref 0.44–1.00)
GFR, Estimated: >60 mL/min (ref >60)
GFR, Estimated: >60 mL/min (ref >60)
Glucose, Bld: 88 mg/dL (ref 70–99)
Glucose, Bld: 83 mg/dL (ref 70–99)
Potassium: 3.9 mmol/L (ref 3.5–5.1)
Potassium: 4 mmol/L (ref 3.5–5.1)
Sodium: 126 mmol/L — ABNORMAL LOW (ref 135–145)
Sodium: 129 mmol/L — ABNORMAL LOW (ref 135–145)
Total Bilirubin: 0.7 mg/dL (ref 0.3–1.2)
Total Bilirubin: 0.6 mg/dL (ref 0.3–1.2)
Total Protein: 5.9 g/dL — ABNORMAL LOW (ref 6.5–8.1)
Total Protein: 6.1 g/dL — ABNORMAL LOW (ref 6.5–8.1)

## 2021-05-03 LAB — MAGNESIUM
Magnesium: 5.2 mg/dL — ABNORMAL HIGH (ref 1.7–2.4)
Magnesium: 5.5 mg/dL — ABNORMAL HIGH (ref 1.7–2.4)

## 2021-05-03 MED ORDER — MISOPROSTOL 50MCG HALF TABLET
50.0000 ug | ORAL_TABLET | ORAL | Status: DC | PRN
  Administered 2021-05-03: 50 ug via BUCCAL

## 2021-05-03 MED ORDER — MISOPROSTOL 50MCG HALF TABLET: 50.0000 ug | ORAL_TABLET | Freq: Once | ORAL | Status: DC

## 2021-05-03 MED ORDER — MISOPROSTOL 50MCG HALF TABLET
50.0000 ug | ORAL_TABLET | Freq: Once | ORAL | Status: DC
  Filled 2021-05-03: qty 1

## 2021-05-03 MED ORDER — FENTANYL CITRATE (PF) 100 MCG/2ML IJ SOLN
100.0000 ug | INTRAMUSCULAR | Status: DC | PRN
  Administered 2021-05-03: 100 ug via INTRAVENOUS

## 2021-05-03 MED ORDER — FENTANYL CITRATE (PF) 100 MCG/2ML IJ SOLN
INTRAMUSCULAR | Status: AC
  Filled 2021-05-03: qty 2

## 2021-05-03 NOTE — Progress Notes (Signed)
Labor Progress Note Mertha Clyatt is a 25 y.o. G1P0 at [redacted]w[redacted]d presented for IOL due to pre-eclampsia with severe features.   S: Starting to feel contractions more painful now on 26 of pit, however pattern irregular still. MVUs not adequate.   O:  BP (!) 143/90   Pulse 99   Temp 98.4 F (36.9 C) (Oral)   Resp 18   Ht 5\' 3"  (1.6 m)   Wt 105.5 kg   LMP 08/10/2020   SpO2 99%   BMI 41.20 kg/m  EFM: 145/mod/15x15/early decels   CVE: Dilation: 4 Effacement (%): 80 Station: -2 Presentation: Vertex Exam by:: Dr [email or web address]   A&P: 25 y.o. G1P0 [redacted]w[redacted]d  #Labor: First check by myself, appears to have some improvement in cervical effacement, but otherwise about the same dilation wise. Difficult to fully assess, however fetal head does feel asynclitic with associated coupling on contractions on toco. Will do a ~ 2 hr pit break with maternal positioning during this time. Placing on lateral side with a peanut.  #Pain: PRN  #FWB: Cat 1  #GBS negative  #Pre-e with severe features: No severe ranges since earlier today. Asymptomatic. Pre-e labs stable. Cont Mg and labetalol. Monitor closely.   [redacted]w[redacted]d, DO 9:47 PM

## 2021-05-03 NOTE — Progress Notes (Signed)
Rechecked in with patient after >2 hour pit break. She has been walking the halls and briefly tolerated lateral side lying with the peanut ball. Cervical check largely unchanged, fetal position feels slightly improved. AROM for about 6 hours now. Cat 1 strip.   Restart pit at 4. Continue walking halls and will attempt lateral lying again with blankets in between legs as patient can not tolerate the peanut. Briefly mentioned possibility of C/S in the future if persistent failure to progress, however not quite into an active labor pattern with reassuring FHT currently, will continue to augment with hopeful progression.   Allayne Stack, DO

## 2021-05-03 NOTE — Progress Notes (Addendum)
Labor Progress Note Cheryl Whitney is a 25 y.o. G1P0 at [redacted]w[redacted]d who presented for IOL due to pre-eclampsia with severe features.   S: Doing well. No concerns at this time.   O:  BP (!) 149/114   Pulse (!) 106   Temp 98.1 F (36.7 C) (Oral)   Resp 16   Ht 5\' 3"  (1.6 m)   Wt 105.5 kg   LMP 08/10/2020   SpO2 99%   BMI 41.20 kg/m   EFM: Baseline 125 bpm, moderate variability, + accels, no decels   CVE: Dilation: 4 Effacement (%): 60 Station: -3 Presentation: Vertex Exam by:: Dr [email or web address]   A&P: 25 y.o. G1P0 [redacted]w[redacted]d   #Labor: SVE unchanged for the last 3 checks. On Pitocin 20 milli-units/min. Will stop Pitocin and given additional dose of Cytotec. Plan to reassess in 4 hours and consider restarting Pitocin at that time.  #Pain: PRN #FWB: Cat 1  #GBS negative  #Pre-eclampsia with severe features: Patient had recent severe range pressure. Improved on recheck. No other symptoms currently. On mag. Will continue to monitor. Repeat CBC, CMP, MG this AM.   #A1GDM: CBGs stable.  [redacted]w[redacted]d, MD 5:59 AM

## 2021-05-03 NOTE — Progress Notes (Signed)
Labor Progress Note Cheryl Whitney is a 25 y.o. G1P0 at [redacted]w[redacted]d presented for IOL due to pre-eclampsia with severe features.   S: Resting in bed.   O:  BP (!) 159/103   Pulse (!) 107   Temp 98.6 F (37 C) (Oral)   Resp 20   Ht 5\' 3"  (1.6 m)   Wt 105.5 kg   LMP 08/10/2020   SpO2 99%   BMI 41.20 kg/m  EFM: baseline 150 /moderate variability /accelerations present / one early deceleration and one variable deceleration present  CVE: Dilation: 4 Effacement (%): 60 Station: -2 Presentation: Vertex Exam by:: dr. [email or web address]   A&P: 25 y.o. G1P0 [redacted]w[redacted]d presented for IOL due to pre-eclampsia with severe features.  #Labor: Progressing well. After consent from patient, AROM with clear fluid and placed IUPC to better quantify contractions. Will continue pitocin. #Pain: Planning for epidural #FWB: category II - very reassuring variability #GBS negative  #Pre-eclampsia with severe features: Currently asymptomatic. BP continues to be intermittently elevated but without severe range pressures. Will order repeat labs (CMP, Mag, CBC).  #A1GDM: CBGs continue to be stable.   [redacted]w[redacted]d, MD 6:06 PM

## 2021-05-03 NOTE — Progress Notes (Signed)
Labor Progress Note Cheryl Whitney is a 25 y.o. G1P0 at [redacted]w[redacted]d presented for IOL due to pre-eclampsia with severe features.   S: Doing well, comfortable  O:  BP 137/78   Pulse (!) 103   Temp 98.7 F (37.1 C) (Oral)   Resp 18   Ht 5\' 3"  (1.6 m)   Wt 105.5 kg   LMP 08/10/2020   SpO2 99%   BMI 41.20 kg/m  EFM: baseline 130 /moderate variability /accelerations present / no decels  CVE: Dilation: 4 Effacement (%): 50, 60 Station: -3 Presentation: Vertex Exam by:: Dr. [email or web address]   A&P: 25 y.o. G1P0 [redacted]w[redacted]d presented for IOL due to pre-eclampsia with severe features.  #Labor: Progressing well. Pitocin restarted at 1118.  #Pain: Planning for epidural #FWB: category I #GBS negative  #Pre-eclampsia with severe features: Currently asymptomatic. CMP with AST 44 (down from 54) and ALT 38 (down from 44). Platelets within normal limits. Continue Mag.   #A1GDM: CBGs continue to be stable.   [redacted]w[redacted]d, MD 12:22 PM

## 2021-05-04 ENCOUNTER — Encounter (HOSPITAL_COMMUNITY): Payer: Self-pay | Admitting: Family Medicine

## 2021-05-04 ENCOUNTER — Inpatient Hospital Stay (HOSPITAL_COMMUNITY): Payer: Medicaid Other | Admitting: Anesthesiology

## 2021-05-04 DIAGNOSIS — O99013 Anemia complicating pregnancy, third trimester: Secondary | ICD-10-CM | POA: Diagnosis present

## 2021-05-04 DIAGNOSIS — O1414 Severe pre-eclampsia complicating childbirth: Secondary | ICD-10-CM

## 2021-05-04 DIAGNOSIS — O24420 Gestational diabetes mellitus in childbirth, diet controlled: Secondary | ICD-10-CM

## 2021-05-04 LAB — CBC
HCT: 31.6 % — ABNORMAL LOW (ref 36.0–46.0)
HCT: 32.2 % — ABNORMAL LOW (ref 36.0–46.0)
Hemoglobin: 10 g/dL — ABNORMAL LOW (ref 12.0–15.0)
Hemoglobin: 9.9 g/dL — ABNORMAL LOW (ref 12.0–15.0)
MCH: 25.4 pg — ABNORMAL LOW (ref 26.0–34.0)
MCH: 25.8 pg — ABNORMAL LOW (ref 26.0–34.0)
MCHC: 30.7 g/dL (ref 30.0–36.0)
MCHC: 31.6 g/dL (ref 30.0–36.0)
MCV: 81.4 fL (ref 80.0–100.0)
MCV: 82.8 fL (ref 80.0–100.0)
Platelets: 317 10*3/uL (ref 150–400)
Platelets: 334 10*3/uL (ref 150–400)
RBC: 3.88 MIL/uL (ref 3.87–5.11)
RBC: 3.89 MIL/uL (ref 3.87–5.11)
RDW: 15.1 % (ref 11.5–15.5)
RDW: 15.4 % (ref 11.5–15.5)
WBC: 19.3 10*3/uL — ABNORMAL HIGH (ref 4.0–10.5)
WBC: 20.4 10*3/uL — ABNORMAL HIGH (ref 4.0–10.5)
nRBC: 0 % (ref 0.0–0.2)
nRBC: 0 % (ref 0.0–0.2)

## 2021-05-04 LAB — COMPREHENSIVE METABOLIC PANEL
ALT: 28 U/L (ref 0–44)
AST: 32 U/L (ref 15–41)
Albumin: 2 g/dL — ABNORMAL LOW (ref 3.5–5.0)
Alkaline Phosphatase: 238 U/L — ABNORMAL HIGH (ref 38–126)
Anion gap: 9 (ref 5–15)
BUN: 8 mg/dL (ref 6–20)
CO2: 20 mmol/L — ABNORMAL LOW (ref 22–32)
Calcium: 6.9 mg/dL — ABNORMAL LOW (ref 8.9–10.3)
Chloride: 97 mmol/L — ABNORMAL LOW (ref 98–111)
Creatinine, Ser: 1.15 mg/dL — ABNORMAL HIGH (ref 0.44–1.00)
GFR, Estimated: >60 mL/min (ref >60)
Glucose, Bld: 87 mg/dL (ref 70–99)
Potassium: 4 mmol/L (ref 3.5–5.1)
Sodium: 126 mmol/L — ABNORMAL LOW (ref 135–145)
Total Bilirubin: 0.6 mg/dL (ref 0.3–1.2)
Total Protein: 5.8 g/dL — ABNORMAL LOW (ref 6.5–8.1)

## 2021-05-04 LAB — GLUCOSE, CAPILLARY
Glucose-Capillary: 93 mg/dL (ref 70–99)
Glucose-Capillary: 85 mg/dL (ref 70–99)
Glucose-Capillary: 118 mg/dL — ABNORMAL HIGH (ref 70–99)
Glucose-Capillary: 87 mg/dL (ref 70–99)

## 2021-05-04 LAB — TYPE AND SCREEN
ABO/RH(D): O POS
Antibody Screen: NEGATIVE

## 2021-05-04 LAB — MAGNESIUM: Magnesium: 7.5 mg/dL (ref 1.7–2.4)

## 2021-05-04 MED ORDER — ACETAMINOPHEN 500 MG PO TABS
1000.0000 mg | ORAL_TABLET | Freq: Once | ORAL | Status: AC
  Administered 2021-05-04: 1000 mg via ORAL
  Filled 2021-05-04: qty 2

## 2021-05-04 MED ORDER — DIPHENHYDRAMINE HCL 25 MG PO CAPS: 25.0000 mg | ORAL_CAPSULE | Freq: Four times a day (QID) | ORAL | Status: DC | PRN

## 2021-05-04 MED ORDER — CARBOPROST TROMETHAMINE 250 MCG/ML IM SOLN
250.0000 ug | Freq: Once | INTRAMUSCULAR | Status: AC
  Administered 2021-05-04: 250 ug via INTRAMUSCULAR

## 2021-05-04 MED ORDER — IBUPROFEN 600 MG PO TABS
600.0000 mg | ORAL_TABLET | Freq: Four times a day (QID) | ORAL | Status: DC
  Administered 2021-05-05 – 2021-05-06 (×6): 600 mg via ORAL
  Filled 2021-05-04 (×7): qty 1

## 2021-05-04 MED ORDER — SODIUM CHLORIDE 0.9% FLUSH
3.0000 mL | Freq: Two times a day (BID) | INTRAVENOUS | Status: DC
  Administered 2021-05-06: 3 mL via INTRAVENOUS

## 2021-05-04 MED ORDER — METHYLERGONOVINE MALEATE 0.2 MG/ML IJ SOLN: 0.2000 mg | INTRAMUSCULAR | Status: DC | PRN

## 2021-05-04 MED ORDER — SENNOSIDES-DOCUSATE SODIUM 8.6-50 MG PO TABS
2.0000 | ORAL_TABLET | ORAL | Status: DC
  Administered 2021-05-05 – 2021-05-06 (×2): 2 via ORAL
  Filled 2021-05-04 (×2): qty 2

## 2021-05-04 MED ORDER — EPHEDRINE 5 MG/ML INJ: 10.0000 mg | INTRAVENOUS | Status: DC | PRN

## 2021-05-04 MED ORDER — PRENATAL MULTIVITAMIN CH
1.0000 | ORAL_TABLET | Freq: Every day | ORAL | Status: DC
  Administered 2021-05-05 – 2021-05-06 (×2): 1 via ORAL
  Filled 2021-05-04 (×2): qty 1

## 2021-05-04 MED ORDER — DIBUCAINE (PERIANAL) 1 % EX OINT: 1.0000 "application " | TOPICAL_OINTMENT | CUTANEOUS | Status: DC | PRN

## 2021-05-04 MED ORDER — PHENYLEPHRINE 40 MCG/ML (10ML) SYRINGE FOR IV PUSH (FOR BLOOD PRESSURE SUPPORT): 80.0000 ug | PREFILLED_SYRINGE | INTRAVENOUS | Status: DC | PRN

## 2021-05-04 MED ORDER — DIPHENOXYLATE-ATROPINE 2.5-0.025 MG PO TABS
2.0000 | ORAL_TABLET | Freq: Once | ORAL | Status: AC
  Administered 2021-05-04: 2 via ORAL
  Filled 2021-05-04: qty 2

## 2021-05-04 MED ORDER — WITCH HAZEL-GLYCERIN EX PADS: 1.0000 "application " | MEDICATED_PAD | CUTANEOUS | Status: DC | PRN

## 2021-05-04 MED ORDER — DIPHENHYDRAMINE HCL 50 MG/ML IJ SOLN: 12.5000 mg | INTRAMUSCULAR | Status: DC | PRN

## 2021-05-04 MED ORDER — LACTATED RINGERS IV SOLN: 500.0000 mL | Freq: Once | INTRAVENOUS | Status: DC

## 2021-05-04 MED ORDER — LIDOCAINE HCL (PF) 1 % IJ SOLN
INTRAMUSCULAR | Status: DC | PRN
  Administered 2021-05-04: 11 mL via EPIDURAL

## 2021-05-04 MED ORDER — DIPHENOXYLATE-ATROPINE 2.5-0.025 MG PO TABS: 2.0000 | ORAL_TABLET | Freq: Once | ORAL | Status: DC

## 2021-05-04 MED ORDER — TRANEXAMIC ACID-NACL 1000-0.7 MG/100ML-% IV SOLN
INTRAVENOUS | Status: AC
  Administered 2021-05-04: 1000 mg via INTRAVENOUS
  Filled 2021-05-04: qty 100

## 2021-05-04 MED ORDER — FENTANYL-BUPIVACAINE-NACL 0.5-0.125-0.9 MG/250ML-% EP SOLN
12.0000 mL/h | EPIDURAL | Status: DC | PRN
  Administered 2021-05-04 (×2): 12 mL/h via EPIDURAL
  Filled 2021-05-04 (×2): qty 250

## 2021-05-04 MED ORDER — BENZOCAINE-MENTHOL 20-0.5 % EX AERO
1.0000 "application " | INHALATION_SPRAY | CUTANEOUS | Status: DC | PRN
  Administered 2021-05-05 – 2021-05-06 (×2): 1 via TOPICAL
  Filled 2021-05-04 (×2): qty 56

## 2021-05-04 MED ORDER — METHYLERGONOVINE MALEATE 0.2 MG/ML IJ SOLN
INTRAMUSCULAR | Status: AC
  Filled 2021-05-04: qty 1

## 2021-05-04 MED ORDER — MEASLES, MUMPS & RUBELLA VAC IJ SOLR: 0.5000 mL | Freq: Once | INTRAMUSCULAR | Status: DC

## 2021-05-04 MED ORDER — COCONUT OIL OIL
1.0000 "application " | TOPICAL_OIL | Status: DC | PRN
  Administered 2021-05-06: 1 via TOPICAL

## 2021-05-04 MED ORDER — MAGNESIUM SULFATE 40 GM/1000ML IV SOLN: 1.0000 g/h | INTRAVENOUS | Status: AC

## 2021-05-04 MED ORDER — SODIUM CHLORIDE 0.9% FLUSH: 3.0000 mL | INTRAVENOUS | Status: DC | PRN

## 2021-05-04 MED ORDER — GENTAMICIN SULFATE 40 MG/ML IJ SOLN: 5.0000 mg/kg | INTRAVENOUS | Status: DC

## 2021-05-04 MED ORDER — TETANUS-DIPHTH-ACELL PERTUSSIS 5-2.5-18.5 LF-MCG/0.5 IM SUSY: 0.5000 mL | PREFILLED_SYRINGE | Freq: Once | INTRAMUSCULAR | Status: DC

## 2021-05-04 MED ORDER — SODIUM CHLORIDE 0.9 % IV SOLN: 2.0000 g | Freq: Four times a day (QID) | INTRAVENOUS | Status: DC

## 2021-05-04 MED ORDER — PIPERACILLIN-TAZOBACTAM 3.375 G IVPB
3.3750 g | Freq: Three times a day (TID) | INTRAVENOUS | Status: AC
  Administered 2021-05-05 (×2): 3.375 g via INTRAVENOUS
  Filled 2021-05-04 (×3): qty 50

## 2021-05-04 MED ORDER — ONDANSETRON HCL 4 MG PO TABS: 4.0000 mg | ORAL_TABLET | ORAL | Status: DC | PRN

## 2021-05-04 MED ORDER — SIMETHICONE 80 MG PO CHEW: 80.0000 mg | CHEWABLE_TABLET | ORAL | Status: DC | PRN

## 2021-05-04 MED ORDER — TRANEXAMIC ACID-NACL 1000-0.7 MG/100ML-% IV SOLN: 1000.0000 mg | Freq: Once | INTRAVENOUS | Status: AC

## 2021-05-04 MED ORDER — ZOLPIDEM TARTRATE 5 MG PO TABS: 5.0000 mg | ORAL_TABLET | Freq: Every evening | ORAL | Status: DC | PRN

## 2021-05-04 MED ORDER — ACETAMINOPHEN 325 MG PO TABS: 650.0000 mg | ORAL_TABLET | ORAL | Status: DC | PRN

## 2021-05-04 MED ORDER — SODIUM CHLORIDE 0.9 % IV SOLN: 250.0000 mL | INTRAVENOUS | Status: DC | PRN

## 2021-05-04 MED ORDER — MEDROXYPROGESTERONE ACETATE 150 MG/ML IM SUSP: 150.0000 mg | INTRAMUSCULAR | Status: DC | PRN

## 2021-05-04 MED ORDER — ONDANSETRON HCL 4 MG/2ML IJ SOLN: 4.0000 mg | INTRAMUSCULAR | Status: DC | PRN

## 2021-05-04 MED ORDER — METHYLERGONOVINE MALEATE 0.2 MG/ML IJ SOLN
0.2000 mg | Freq: Once | INTRAMUSCULAR | Status: AC
  Administered 2021-05-04: 0.2 mg via INTRAMUSCULAR

## 2021-05-04 MED ORDER — CARBOPROST TROMETHAMINE 250 MCG/ML IM SOLN
INTRAMUSCULAR | Status: AC
  Filled 2021-05-04: qty 1

## 2021-05-04 MED ORDER — METHYLERGONOVINE MALEATE 0.2 MG PO TABS: 0.2000 mg | ORAL_TABLET | ORAL | Status: DC | PRN

## 2021-05-04 NOTE — Progress Notes (Signed)
Cheryl Whitney is a 25 y.o. G1P0 at [redacted]w[redacted]d admitted for IOL for preE with SF now on Day 3 of IOL  Subjective: Still has some pelvic pressure  Objective: BP 129/90   Pulse (!) 119   Temp 98.1 F (36.7 C) (Oral)   Resp 16   Ht 5\' 3"  (1.6 m)   Wt 105.5 kg   LMP 08/10/2020   SpO2 100%   BMI 41.20 kg/m  I/O last 3 completed shifts: In: 4301.2 [P.O.:1305; I.V.:2930.8; Other:65.4] Out: 5150 [Urine:5150] No intake/output data recorded.  FHT:  FHR: 150 bpm, variability: minimal ,  accelerations:  Abscent,  decelerations:  Absent UC:   every 3 minutes SVE:   Dilation: 10 Effacement (%): 100 Station: Plus 1 Exam by:: Dr. [email or web address] Labs: Lab Results  Component Value Date   WBC 19.3 (H) 05/04/2021   HGB 9.9 (L) 05/04/2021   HCT 32.2 (L) 05/04/2021   MCV 82.8 05/04/2021   PLT 317 05/04/2021    Assessment / Plan: Induction of labor due to preE with SF now on day 3 of IOL.  Will start pushing now.  Labor course so far: -S/p cytotec x3, foley bulb.  -On pitocin since 11/14 at 2020. - had pitbreak 11/15 afternoon - AROM on 11/15 at 1753  - another pitbreak 11/15 evening - Restarted pitocin most recently at 0001 on 11/16.   Preeclampsia w/SF (BP at admission): Currently asymptomatic, on magnesium sulfate and labs stable, BP normotensive to mild range. Last severe range 11/15 at 0758.  - continue Mg - Labetalol protocol ordered - Labs at 6PM  #GDMA1 EFW 95%ile (3429gm on 11/11). BG mostly 80s-90s. Last BG 118. - Will continue q4hr checks.  - If above goal can start SSI and assess for need for endotool  Fetal Wellbeing:  Category II - minimal variability in setting of Mg x3days. No decels.  Pain Control:  Epidural I/D:   GBS neg   13/11, MD

## 2021-05-04 NOTE — Discharge Summary (Signed)
Postpartum Discharge Summary  Date of Service updated 05/06/2021    Patient Name: Cheryl Whitney DOB: 1995/07/06 MRN: [id number]  Date of admission: 05/02/2021 Delivery date:05/04/2021  Delivering provider: Patriciaann Clan  Date of discharge: 05/06/2021   Admitting diagnosis: Pre-eclampsia [O14.90] Intrauterine pregnancy: [redacted]w[redacted]d    Secondary diagnosis:  Principal Problem:   Severe preeclampsia, delivered Active Problems:   Supervision of high risk pregnancy, antepartum   Gestational diabetes   Anemia in pregnancy, third trimester   PPH (postpartum hemorrhage)   Chorioamnionitis   Acute blood loss anemia   Anemia due to acute blood loss  Additional problems: AKI    Discharge diagnosis: Preterm Pregnancy Delivered, Preeclampsia (severe), GDM A1, and PPH                                              Post partum procedures: no Augmentation: AROM, Pitocin, Cytotec, and IP Foley Complications: Intrauterine Inflammation or infection (Chorioamniotis) and Hemorrhage>10051m Hospital course: Induction of Labor With Vaginal Delivery   [age].o. yo G1P0101 at 3657w6ds admitted to the hospital 05/02/2021 for induction of labor.  Indication for induction: Preeclampsia.  Patient had an uncomplicated labor course as follows: her creatine was above 1.4 before improving to 0.95 at discharge. Her Hb dropped to 7.8 due to hemorrhage and Fe supplement was prescribed Membrane Rupture Time/Date: 5:53 PM ,05/03/2021   Delivery Method:Vaginal, Spontaneous  Episiotomy: None  Lacerations:  1st degree;Perineal  Details of delivery can be found in separate delivery note.  Patient had a routine postpartum course. Patient is discharged home 05/06/21.  Newborn Data: Birth date:05/04/2021  Birth time:9:16 PM  Gender:Female  Living status:Living  Apgars:6 ,8  Weight:3350 g   Magnesium Sulfate received: Yes: Neuroprotection BMZ received: No Rhophylac:No MMR:No T-DaP:Given  prenatally Flu: No Transfusion:No  Physical exam  Vitals:   05/06/21 0157 05/06/21 0629 05/06/21 1028 05/06/21 1555  BP: 123/62 113/80 119/71 127/81  Pulse: 93 84 (!) 103 87  Resp: _0 Temp: 97.7 F (36.5 C) 97.6 F (36.4 C) 97.7 F (36.5 C) 97.8 F (36.6 C)  TempSrc: Oral Oral Oral Oral  SpO2: 100% 99% 100% 99%  Weight:      Height:       General: alert, cooperative, and no distress Lochia: appropriate Uterine Fundus: firm Incision: N/A DVT Evaluation: No evidence of DVT seen on physical exam. Labs: Lab Results  Component Value Date   WBC 31.4 (H) 05/05/2021   HGB 7.8 (L) 05/05/2021   HCT 24.7 (L) 05/05/2021   MCV 82.1 05/05/2021   PLT 256 05/05/2021   CMP Latest Ref Rng & Units 05/06/2021  Glucose 70 - 99 mg/dL 71  BUN 6 - 20 mg/dL 14  Creatinine 0.44 - 1.00 mg/dL 0.97  Sodium 135 - 145 mmol/L 134(L)  Potassium 3.5 - 5.1 mmol/L 3.8  Chloride 98 - 111 mmol/L 106  CO2 22 - 32 mmol/L 23  Calcium 8.9 - 10.3 mg/dL 7.1(L)  Total Protein 6.5 - 8.1 g/dL -  Total Bilirubin 0.3 - 1.2 mg/dL -  Alkaline Phos 38 - 126 U/L -  AST 15 - 41 U/L -  ALT 0 - 44 U/L -   Edinburgh Score: No flowsheet data found.   After visit meds:  Allergies as of 05/06/2021   No Known Allergies  Medication List     TAKE these medications    acetaminophen 500 MG tablet Commonly known as: TYLENOL Take 2 tablets (1,000 mg total) by mouth every 8 (eight) hours as needed for moderate pain.   ferrous sulfate 325 (65 FE) MG tablet Take 1 tablet (325 mg total) by mouth every other day.   pantoprazole 20 MG tablet Commonly known as: Protonix Take 1 tablet (20 mg total) by mouth daily.   PRENATAL VITAMINS PO Take 1 tablet by mouth daily.               Durable Medical Equipment  (From admission, onward)           Start     Ordered   05/05/21 1540  For home use only DME double electric breast pump  Once       Comments: Z39.1   05/05/21 1541              Discharge home in stable condition Infant Feeding: Breast Infant Disposition:NICU Discharge instruction: per After Visit Summary and Postpartum booklet. Activity: Advance as tolerated. Pelvic rest for 6 weeks.  Diet: carb modified diet Future Appointments: Future Appointments  Date Time Provider [place]  05/16/2021 10:20 AM [facility] NURSE [facility]  06/16/2021  8:15 AM Woodroe Mode, MD [facility]  06/16/2021  8:50 AM [facility]   Follow up Visit:  [facility] Follow up in 1 week(s).   Specialty: Obstetrics and Gynecology Why: BP check Contact information: [place] [address] [phone]                Message sent by Dr Higinio Plan to [facility] on 05/04/2021:   Please schedule this patient for a In person postpartum visit in 6 weeks with the following provider: MD. Additional Postpartum F/U:2 hour GTT, Incision check 1 week, and BP check 1 week  High risk pregnancy complicated by: GDM and HTN Delivery mode:  Vaginal, Spontaneous  Anticipated Birth Control: plans IUD   05/06/2021 Emeterio Reeve, MD

## 2021-05-04 NOTE — Progress Notes (Signed)
Labor Progress Note Cheryl Whitney is a 25 y.o. G1P0 at [redacted]w[redacted]d presented for IOL due pre-e with severe features.   S: Doing well per RN.   O:  BP 139/74   Pulse (!) 116   Temp 97.6 F (36.4 C) (Oral)   Resp 18   Ht 5\' 3"  (1.6 m)   Wt 105.5 kg   LMP 08/10/2020   SpO2 98%   BMI 41.20 kg/m  EFM: 130/mod/none/none  CVE: Dilation: 4.5 Effacement (%): 80 Station: -1 Presentation: Vertex Exam by:: [email or web address]   A&P: 25 y.o. G1P0 [redacted]w[redacted]d  #Labor: Minimal to no progression since last checked, now on about 20 of pit with inadequate contractions. Started her induction on 11/14, pit initially started that evening with two breaks since then, most recently restarted at midnight. ROM for 13 hours. FHT has remained reassuring/Cat I the majority of the time. Dr. 12/14 made aware. Will continue titrating pit as tolerated, max of 30.   #Pain: Epidural in place, as she did become very uncomfortable with contractions yesterday evening.   #Pre-eclampsia with severe features: BP currently WNL. Asymptomatic. Cont Mg.   #A1GDM: CBGS appropriate. Cont monitoring q4.    Jolayne Panther, DO 6:57 AM

## 2021-05-04 NOTE — Progress Notes (Signed)
Cheryl Whitney is a 25 y.o. G1P0 at [redacted]w[redacted]d b admitted for IOL for preE with SF now on Day 3 of IOL  Subjective: Feels comfortable with epidural. Does feel some cramping at times.  Objective: BP 139/76   Pulse (!) 117   Temp 97.9 F (36.6 C) (Oral)   Resp 18   Ht 5\' 3"  (1.6 m)   Wt 105.5 kg   LMP 08/10/2020   SpO2 98%   BMI 41.20 kg/m  I/O last 3 completed shifts: In: 8113 [P.O.:2655; I.V.:5392.6; Other:65.4] Out: 7200 [Urine:7200] Total I/O In: 54.2 [I.V.:54.2] Out: -   FHT:  FHR: 150 bpm, variability: minimal ,  accelerations:  Abscent,  decelerations:  Absent UC:   irregular, every 3-5 minutes SVE:   Dilation: 4.5 Effacement (%): 80 Station: -1 Exam by:: [email or web address] RN  Labs: Lab Results  Component Value Date   WBC 20.4 (H) 05/03/2021   HGB 10.0 (L) 05/03/2021   HCT 31.6 (L) 05/03/2021   MCV 81.4 05/03/2021   PLT 334 05/03/2021    Assessment / Plan: Induction of labor due to preE with SF now on day 3 of IOL.  Labor course so far: -S/p cytotec x3, foley bulb.  -On pitocin since 11/14 at 2020. - had pitbreak 11/15 afternoon -AROM on 11/15 at 1753  - another pitbreak 11/15 evening - Restarted pitocin most recently at 0001 on 11/16.   Cervix at 4.5 cm dilated for >24 hrs and now progressed to 6 cm on 11/16 at 1421  - Will continue on pitocin and reassess in 4-6 hours unless patient clinical status changes or there are changes on strip that warrant exam.  Preeclampsia w/SF (BP at admission): Currently asymptomatic, on magnesium sulfate and labs stable, BP normotensive to mild range. Last severe range 11/15 at 0758.  - continue Mg - Labetalol protocol ordered - Labs at 6PM  #GDMA1 EFW 95%ile (3429gm on 11/11). BG mostly 80s-90s. Last BG 118. - Will continue q4hr checks.  - If above goal can start SSI and assess for need for endotool  Fetal Wellbeing:  Category II - minimal variability in setting of Mg x3days. No decels.  Pain Control:   Epidural I/D:   GBS neg   13/11, MD, MPH OB Fellow, Faculty Practice

## 2021-05-04 NOTE — Anesthesia Preprocedure Evaluation (Signed)
Anesthesia Evaluation  Patient identified by MRN, date of birth, ID band Patient awake    Reviewed: Allergy & Precautions, NPO status , Patient's Chart, lab work & pertinent test results  Airway Mallampati: II  TM Distance: >3 FB Neck ROM: Full    Dental no notable dental hx.    Pulmonary neg pulmonary ROS,    Pulmonary exam normal breath sounds clear to auscultation       Cardiovascular hypertension, Normal cardiovascular exam Rhythm:Regular Rate:Normal     Neuro/Psych negative neurological ROS  negative psych ROS   GI/Hepatic negative GI ROS, Neg liver ROS,   Endo/Other  negative endocrine ROSdiabetes, Gestational  Renal/GU negative Renal ROS  negative genitourinary   Musculoskeletal negative musculoskeletal ROS (+)   Abdominal (+) + obese,   Peds negative pediatric ROS (+)  Hematology negative hematology ROS (+)   Anesthesia Other Findings   Reproductive/Obstetrics (+) Pregnancy                             Anesthesia Physical Anesthesia Plan  ASA: 3  Anesthesia Plan: Epidural   Post-op Pain Management:    Induction:   PONV Risk Score and Plan:   Airway Management Planned:   Additional Equipment:   Intra-op Plan:   Post-operative Plan:   Informed Consent:   Plan Discussed with:   Anesthesia Plan Comments:         Anesthesia Quick Evaluation

## 2021-05-04 NOTE — Anesthesia Procedure Notes (Signed)
Epidural Patient location during procedure: OB Start time: 05/04/2021 12:53 AM End time: 05/04/2021 1:09 AM  Staffing Anesthesiologist: Lowella Curb, MD Performed: anesthesiologist   Preanesthetic Checklist Completed: patient identified, IV checked, site marked, risks and benefits discussed, surgical consent, monitors and equipment checked, pre-op evaluation and timeout performed  Epidural Patient position: sitting Prep: ChloraPrep Patient monitoring: heart rate, cardiac monitor, continuous pulse ox and blood pressure Approach: midline Location: L2-L3 Injection technique: LOR saline  Needle:  Needle type: Tuohy  Needle gauge: 17 G Needle length: 9 cm Needle insertion depth: 6 cm Catheter type: closed end flexible Catheter size: 20 Guage Catheter at skin depth: 10 cm Test dose: negative  Assessment Events: blood not aspirated, injection not painful, no injection resistance, no paresthesia and negative IV test  Additional Notes Reason for block:procedure for pain

## 2021-05-04 NOTE — Anesthesia Postprocedure Evaluation (Signed)
Anesthesia Post Note  Patient: Christalyn Goertz  Procedure(s) Performed: AN AD HOC LABOR EPIDURAL     Patient location during evaluation: Mother Baby Anesthesia Type: Epidural Level of consciousness: awake and alert Pain management: pain level controlled Vital Signs Assessment: post-procedure vital signs reviewed and stable Respiratory status: spontaneous breathing, nonlabored ventilation and respiratory function stable Cardiovascular status: stable Postop Assessment: no headache, no backache and epidural receding Anesthetic complications: no   No notable events documented.  Last Vitals:  Vitals:   05/04/21 2231 05/04/21 2246  BP: 135/74 125/76  Pulse: (!) 119 (!) 117  Resp: 16   Temp:    SpO2:      Last Pain:  Vitals:   05/04/21 2126  TempSrc: Axillary  PainSc:    Pain Goal: Patients Stated Pain Goal: 9 (05/03/21 1800)              Epidural/Spinal Function Cutaneous sensation: Tingles (05/04/21 2245), Patient able to flex knees: Yes (05/04/21 2245), Patient able to lift hips off bed: Yes (05/04/21 2245), Back pain beyond tenderness at insertion site: No (05/04/21 2245), Progressively worsening motor and/or sensory loss: No (05/04/21 2245), Bowel and/or bladder incontinence post epidural: No (05/04/21 2245)  Paraskevi Funez

## 2021-05-04 NOTE — Lactation Note (Signed)
This note was copied from a baby's chart. Lactation Consultation Note  Patient Name: Cheryl Whitney Date: 05/04/2021   Age:23 hours  LC talked to RN, Alexis [place]. Mom Mag for last few days, not very alert at this time. Mom feeding choice is breast and formula. RN will formula feed at this time. Mom will be able to work on breastfeeding in am more stable.   Maternal Data    Feeding    LATCH Score                    Lactation Tools Discussed/Used    Interventions    Discharge    Consult Status      Cheryl Whitney  [name] 05/04/2021, 10:13 PM

## 2021-05-04 NOTE — Progress Notes (Addendum)
Sharis Keeran is a 25 y.o. G1P0 at [redacted]w[redacted]d admitted for IOL for preE with SF now on Day 3 of IOL  Subjective: Reports pelvic pressure  Objective: BP (!) 143/75   Pulse (!) 108   Temp 98.1 F (36.7 C) (Oral)   Resp 18   Ht 5\' 3"  (1.6 m)   Wt 105.5 kg   LMP 08/10/2020   SpO2 100%   BMI 41.20 kg/m  I/O last 3 completed shifts: In: 8113 [P.O.:2655; I.V.:5392.6; Other:65.4] Out: 7200 [Urine:7200] Total I/O In: 54.2 [I.V.:54.2] Out: 650 [Urine:650]  FHT:  FHR: 150 bpm, variability: minimal ,  accelerations:  Abscent,  decelerations:  Absent UC:   irregular, every 3-5 minutes SVE:   Dilation: 9 Effacement (%): 80 Station: 0 Exam by:: Dr. [email or web address] Anterior lip and to the right side Labs: Lab Results  Component Value Date   WBC 20.4 (H) 05/03/2021   HGB 10.0 (L) 05/03/2021   HCT 31.6 (L) 05/03/2021   MCV 81.4 05/03/2021   PLT 334 05/03/2021    Assessment / Plan: Induction of labor due to preE with SF now on day 3 of IOL.  Will reevaluate in one hour and start pushing then   Labor course so far: -S/p cytotec x3, foley bulb.  -On pitocin since 11/14 at 2020. - had pitbreak 11/15 afternoon - AROM on 11/15 at 1753  - another pitbreak 11/15 evening - Restarted pitocin most recently at 0001 on 11/16.   Preeclampsia w/SF (BP at admission): Currently asymptomatic, on magnesium sulfate and labs stable, BP normotensive to mild range. Last severe range 11/15 at 0758.  - continue Mg - Labetalol protocol ordered - Labs at 6PM  #GDMA1 EFW 95%ile (3429gm on 11/11). BG mostly 80s-90s. Last BG 118. - Will continue q4hr checks.  - If above goal can start SSI and assess for need for endotool  Fetal Wellbeing:  Category II - minimal variability in setting of Mg x3days. No decels.  Pain Control:  Epidural I/D:   GBS neg   13/11, MD [facility]

## 2021-05-05 ENCOUNTER — Encounter: Payer: Medicaid Other | Admitting: Family Medicine

## 2021-05-05 DIAGNOSIS — D62 Acute posthemorrhagic anemia: Secondary | ICD-10-CM

## 2021-05-05 DIAGNOSIS — O411290 Chorioamnionitis, unspecified trimester, not applicable or unspecified: Secondary | ICD-10-CM

## 2021-05-05 LAB — CBC WITH DIFFERENTIAL/PLATELET
Abs Immature Granulocytes: 0 10*3/uL (ref 0.00–0.07)
Abs Immature Granulocytes: 0.6 10*3/uL — ABNORMAL HIGH (ref 0.00–0.07)
Band Neutrophils: 11 %
Basophils Absolute: 0 10*3/uL (ref 0.0–0.1)
Basophils Absolute: 0.1 10*3/uL (ref 0.0–0.1)
Basophils Relative: 0 %
Basophils Relative: 0 %
Eosinophils Absolute: 0 10*3/uL (ref 0.0–0.5)
Eosinophils Absolute: 0.1 10*3/uL (ref 0.0–0.5)
Eosinophils Relative: 0 %
Eosinophils Relative: 0 %
HCT: 24.7 % — ABNORMAL LOW (ref 36.0–46.0)
HCT: 26.2 % — ABNORMAL LOW (ref 36.0–46.0)
Hemoglobin: 7.8 g/dL — ABNORMAL LOW (ref 12.0–15.0)
Hemoglobin: 8.4 g/dL — ABNORMAL LOW (ref 12.0–15.0)
Immature Granulocytes: 2 %
Lymphocytes Relative: 4 %
Lymphocytes Relative: 5 %
Lymphs Abs: 1 10*3/uL (ref 0.7–4.0)
Lymphs Abs: 1.6 10*3/uL (ref 0.7–4.0)
MCH: 25.9 pg — ABNORMAL LOW (ref 26.0–34.0)
MCH: 25.9 pg — ABNORMAL LOW (ref 26.0–34.0)
MCHC: 31.6 g/dL (ref 30.0–36.0)
MCHC: 32.1 g/dL (ref 30.0–36.0)
MCV: 80.9 fL (ref 80.0–100.0)
MCV: 82.1 fL (ref 80.0–100.0)
Monocytes Absolute: 1.9 10*3/uL — ABNORMAL HIGH (ref 0.1–1.0)
Monocytes Absolute: 2 10*3/uL — ABNORMAL HIGH (ref 0.1–1.0)
Monocytes Relative: 6 %
Monocytes Relative: 8 %
Neutro Abs: 22.4 10*3/uL — ABNORMAL HIGH (ref 1.7–7.7)
Neutro Abs: 27.9 10*3/uL — ABNORMAL HIGH (ref 1.7–7.7)
Neutrophils Relative %: 78 %
Neutrophils Relative %: 86 %
Platelets: 256 10*3/uL (ref 150–400)
Platelets: 286 10*3/uL (ref 150–400)
RBC: 3.01 MIL/uL — ABNORMAL LOW (ref 3.87–5.11)
RBC: 3.24 MIL/uL — ABNORMAL LOW (ref 3.87–5.11)
RDW: 15.2 % (ref 11.5–15.5)
RDW: 15.4 % (ref 11.5–15.5)
WBC: 26.1 10*3/uL — ABNORMAL HIGH (ref 4.0–10.5)
WBC: 31.4 10*3/uL — ABNORMAL HIGH (ref 4.0–10.5)
nRBC: 0 % (ref 0.0–0.2)
nRBC: 0 % (ref 0.0–0.2)
nRBC: 0 /100 WBC

## 2021-05-05 LAB — COMPREHENSIVE METABOLIC PANEL
ALT: 29 U/L (ref 0–44)
AST: 62 U/L — ABNORMAL HIGH (ref 15–41)
Albumin: 1.6 g/dL — ABNORMAL LOW (ref 3.5–5.0)
Alkaline Phosphatase: 169 U/L — ABNORMAL HIGH (ref 38–126)
Anion gap: 8 (ref 5–15)
BUN: 12 mg/dL (ref 6–20)
CO2: 19 mmol/L — ABNORMAL LOW (ref 22–32)
Calcium: 6.6 mg/dL — ABNORMAL LOW (ref 8.9–10.3)
Chloride: 96 mmol/L — ABNORMAL LOW (ref 98–111)
Creatinine, Ser: 1.47 mg/dL — ABNORMAL HIGH (ref 0.44–1.00)
GFR, Estimated: 51 mL/min — ABNORMAL LOW (ref >60)
Glucose, Bld: 85 mg/dL (ref 70–99)
Potassium: 4.7 mmol/L (ref 3.5–5.1)
Sodium: 123 mmol/L — ABNORMAL LOW (ref 135–145)
Total Bilirubin: 0.5 mg/dL (ref 0.3–1.2)
Total Protein: 4.8 g/dL — ABNORMAL LOW (ref 6.5–8.1)

## 2021-05-05 LAB — MAGNESIUM: Magnesium: 6.4 mg/dL (ref 1.7–2.4)

## 2021-05-05 MED ORDER — SODIUM CHLORIDE 0.9 % IV SOLN
500.0000 mg | Freq: Once | INTRAVENOUS | Status: AC
  Administered 2021-05-05: 500 mg via INTRAVENOUS
  Filled 2021-05-05: qty 25

## 2021-05-05 MED ORDER — LACTATED RINGERS IV SOLN: INTRAVENOUS | Status: DC

## 2021-05-05 NOTE — Lactation Note (Signed)
This note was copied from a baby's chart. Lactation Consultation Note  Patient Name: Girl Qiana Landgrebe [id number] Date: 05/05/2021 Reason for consult: Initial assessment;Primapara;1st time breastfeeding;NICU baby;Late-preterm 34-36.6wks Age:23 hours  Lactation conducted an initial consult with Ms. Strawder. She states that she does not have any prior knowledge or experience with breast feeding and she had some questions about her options related to breast feeding. She shared interest in pumping, and I set up a DEBP and helped her to initiate the process. I provided education on what to expect with pumping in days 1-3 and recommended that she pump every three hours. I encouraged her to pump at night.  Cleaning products were also provided.   Ms. Zaucha has [employer]. After my visit, I asked her RN to place a Stork Pump referral.    Maternal Data Does the patient have breastfeeding experience prior to this delivery?: No  Feeding Mother's Current Feeding Choice: Breast Milk and Formula Nipple Type: Nfant Slow Flow (purple)   Lactation Tools Discussed/Used Tools: Pump;Flanges Flange Size: 24 Breast pump type: Double-Electric Breast Pump Pump Education: Setup, frequency, and cleaning;Milk Storage Reason for Pumping: NICU Pumping frequency: none at this time. Rec q3 hours Pumped volume: 0 mL  Interventions Interventions: Breast feeding basics reviewed;DEBP;Education  Discharge Pump: DEBP  Consult Status Consult Status: Follow-up Date: 05/05/21 Follow-up type: In-patient    Walker Shadow 05/05/2021, 3:38 PM

## 2021-05-05 NOTE — Anesthesia Postprocedure Evaluation (Signed)
Anesthesia Post Note  Patient: Cheryl Whitney  Procedure(s) Performed: AN AD HOC LABOR EPIDURAL     Patient location during evaluation: OB High Risk Anesthesia Type: Epidural Level of consciousness: awake and alert Pain management: pain level controlled Vital Signs Assessment: post-procedure vital signs reviewed and stable Respiratory status: spontaneous breathing Cardiovascular status: stable Postop Assessment: no headache, adequate PO intake, no backache, able to ambulate, patient able to bend at knees, epidural receding and no apparent nausea or vomiting Anesthetic complications: no   No notable events documented.  Last Vitals:  Vitals:   05/05/21 0644 05/05/21 0801  BP:  121/77  Pulse:  79  Resp: 16 17  Temp:  (!) 36.3 C  SpO2:  99%    Last Pain:  Vitals:   05/05/21 0801  TempSrc: Oral  PainSc: 2    Pain Goal: Patients Stated Pain Goal: 9 (05/03/21 1800)                 Jennye Moccasin, Weldon Picking

## 2021-05-05 NOTE — Progress Notes (Signed)
Post Partum Day 1 Subjective: No complaints, voiding, and tolerating PO.  Patient denies any headaches, visual symptoms, RUQ/epigastric pain or other concerning symptoms.  Baby is doing well in NICU.  Objective: Blood pressure 113/72, pulse 99, temperature (!) 97.2 F (36.2 C), temperature source Axillary, resp. rate 16, height 5\' 3"  (1.6 m), weight 105.5 kg, last menstrual period 08/10/2020, SpO2 99 %, unknown if currently breastfeeding.  Physical Exam:  General: alert and no distress Lochia: appropriate Uterine Fundus: firm, mildly tender DVT Evaluation: No evidence of DVT seen on physical exam. Negative Homan's sign. No cords or calf tenderness.  Recent Labs    05/04/21 1734 05/04/21 2333  HGB 9.9* 8.4*  HCT 32.2* 26.2*    Assessment/Plan: Continue magnesium sulfate for 24 hours postpartum. Follow up labs today for Hgb and Cr, Venofer ordered for anemia for now. Continue to monitor BP Continue Zosyn x 24 hours for chorioamnionitis Routine postpartum care   LOS: 3 days   Cheryl Whitney 05/05/2021, 7:36 AM

## 2021-05-05 NOTE — Progress Notes (Signed)
Patient screened out for psychosocial assessment since none of the following apply: °Psychosocial stressors documented in mother or baby's chart °Gestation less than 32 weeks °Code at delivery  °Infant with anomalies °Please contact the Clinical Social Worker if specific needs arise, by MOB's request, or if MOB scores greater than 9/yes to question 10 on Edinburgh Postpartum Depression Screen. ° °Mckenzie Toruno Boyd-Gilyard, MSW, LCSW °Clinical Social Work °[phone] °  °

## 2021-05-06 ENCOUNTER — Other Ambulatory Visit (HOSPITAL_COMMUNITY): Payer: Self-pay

## 2021-05-06 LAB — BASIC METABOLIC PANEL
Anion gap: 5 (ref 5–15)
BUN: 14 mg/dL (ref 6–20)
CO2: 23 mmol/L (ref 22–32)
Calcium: 7.1 mg/dL — ABNORMAL LOW (ref 8.9–10.3)
Chloride: 106 mmol/L (ref 98–111)
Creatinine, Ser: 0.97 mg/dL (ref 0.44–1.00)
GFR, Estimated: >60 mL/min (ref >60)
Glucose, Bld: 71 mg/dL (ref 70–99)
Potassium: 3.8 mmol/L (ref 3.5–5.1)
Sodium: 134 mmol/L — ABNORMAL LOW (ref 135–145)

## 2021-05-06 LAB — MAGNESIUM: Magnesium: 3.5 mg/dL — ABNORMAL HIGH (ref 1.7–2.4)

## 2021-05-06 MED ORDER — FERROUS SULFATE 325 (65 FE) MG PO TABS
325.0000 mg | ORAL_TABLET | ORAL | 3 refills | Status: DC
  Filled 2021-05-06: qty 30, 60d supply, fill #0

## 2021-05-06 MED ORDER — IBUPROFEN 600 MG PO TABS: 600.0000 mg | ORAL_TABLET | Freq: Four times a day (QID) | ORAL | Status: DC | PRN

## 2021-05-06 NOTE — Lactation Note (Signed)
This note was copied from a baby's chart.  NICU Lactation Consultation Note  Patient Name: Cheryl Whitney [id number] Date: 05/06/2021 Age:23 hours   Subjective Reason for consult: Follow-up assessment Mother has initiated pumping but is pumping infrequently. We reviewed importance of frequency. I also taught her how to hand express.  Stork pump delivered her pump yesterday.  Objective Infant data: Mother's Current Feeding Choice: Breast Milk and Formula  Infant feeding assessment Scale for Readiness: 2 Scale for Quality: 1     Maternal data: G1P0101  Vaginal, Spontaneous Does the patient have breastfeeding experience prior to this delivery?: No Breasts wnl, + colostrum  Pumping frequency: 2x yesterday Pumped volume: 0 mL Flange Size: 24   Pump: Stork Pump   Assessment Infant: Feeding Status: Ad lib   Maternal: Milk volume: Normal   Intervention/Plan Interventions: Education  Tools: Pump; Flanges  Plan: Consult Status: Follow-up  NICU Follow-up type: Verify absence of engorgement; Verify onset of copious milk  Mother to pump q3.    Elder Negus 05/06/2021, 10:18 AM

## 2021-05-06 NOTE — Progress Notes (Signed)
Post Partum Day 2 Subjective: no complaints  Objective: Blood pressure 119/71, pulse (!) 103, temperature 97.7 F (36.5 C), temperature source Oral, resp. rate 18, height 5\' 3"  (1.6 m), weight 105.5 kg, last menstrual period 08/10/2020, SpO2 100 %, unknown if currently breastfeeding.  Physical Exam:  General: alert, cooperative, and no distress Lochia: appropriate Uterine Fundus: firm DVT Evaluation: No evidence of DVT seen on physical exam.  Recent Labs    05/04/21 2333 05/05/21 0728  HGB 8.4* 7.8*  HCT 26.2* 24.7*    Assessment/Plan: BP and BG management and consider d/c Cr and Mg level   LOS: 4 days   05/07/21 05/06/2021, 11:51 AM

## 2021-05-06 NOTE — Plan of Care (Signed)
  Problem: Nutrition: Goal: Adequate nutrition will be maintained Outcome: Adequate for Discharge   Problem: Activity: Goal: Risk for activity intolerance will decrease Outcome: Adequate for Discharge   Problem: Coping: Goal: Level of anxiety will decrease Outcome: Adequate for Discharge   Problem: Coping: Goal: Ability to identify and utilize available resources and services will improve Outcome: Adequate for Discharge

## 2021-05-07 ENCOUNTER — Ambulatory Visit: Payer: Self-pay

## 2021-05-07 NOTE — Lactation Note (Signed)
This note was copied from a baby's chart.  NICU Lactation Consultation Note  Patient Name: Girl Shaterra Sanzone [id number] Date: 05/07/2021 Age:23 hours   Subjective Reason for consult: Follow-up assessment Mother denies breast changes at 67-hours post delivery. She has not pumped in the past 24 hours and is bottle feeding. LC provided a hand pump to use prn to avoid engorgement.   Objective Infant data: Mother's Current Feeding Choice: Formula  Infant feeding assessment Scale for Readiness: 1 Scale for Quality: 1   Maternal data: G1P0101  Vaginal, Spontaneous  Pumping frequency: no pumping in past 24 hours  Pump: Manual  Assessment Infant: Feeding Status: Ad lib  Intervention/Plan Interventions: Education  Plan: Consult Status: Follow-up  NICU Follow-up type: Verify onset of copious milk; Verify absence of engorgement  Mother to use hand pump prn. She will bring her pump kit from the car if she wants to use electric pump.  Gwynne Edinger 05/07/2021, 4:58 PM

## 2021-05-08 ENCOUNTER — Ambulatory Visit: Payer: Self-pay

## 2021-05-08 NOTE — Lactation Note (Signed)
This note was copied from a baby's chart. Lactation Consultation Note  Patient Name: Cheryl Whitney [id number] Date: 05/08/2021 Reason for consult: Follow-up assessment;Early term 37-38.6wks;1st time breastfeeding;NICU baby;Primapara;Other (Comment) (baby's discharge) Age:23 days  Visited with mom of 52 hours old ETI NICU female, parents are taking baby home today. Reviewed discharge education, pumping schedule, lactogenesis II, supply/demand. Encouraged mom to keep pumping after feedings at the breast to protect there supply. LC OP referral to Renee Rival. was sent; mom voiced she wants to work on BF.  RN Eunice Blase reported concerns to [facility] regarding mom taking baby to breast only without doing any additional formula supplementation. Asked RN to educate parents regarding formula needs, mom was a [facility] participant @ the [employer] office. RN Eunice Blase reported back to [facility] saying she did, parents will schedule [facility] appt tomorrow.  All questions and concerns answered, parents to contact NICU LC PRN.  Maternal Data  Mom's supply is BNL probably due to inconsistent pumping  Feeding Mother's Current Feeding Choice: Breast Milk and Formula Nipple Type: Slow - flow  Lactation Tools Discussed/Used Tools: Pump;Flanges Flange Size: 24 Breast pump type: Double-Electric Breast Pump Pump Education: Setup, frequency, and cleaning;Milk Storage Reason for Pumping: ETI in NICU Pumping frequency: 3-4 times/24 hours Pumped volume: 6 mL  Interventions Interventions: Breast feeding basics reviewed;DEBP;Education  Discharge Discharge Education: Outpatient recommendation;Outpatient Epic message sent Pump: DEBP;Personal (she got a pump from her insurance company yesterday)  Consult Status Consult Status: Follow-up Date: 05/08/21 Follow-up type: In-patient   Cheryl Whitney 05/08/2021, 1:21 PM

## 2021-05-09 LAB — SURGICAL PATHOLOGY

## 2021-05-16 ENCOUNTER — Ambulatory Visit: Payer: Medicaid Other

## 2021-05-17 ENCOUNTER — Telehealth (HOSPITAL_COMMUNITY): Payer: Self-pay | Admitting: *Deleted

## 2021-05-17 DIAGNOSIS — Z1331 Encounter for screening for depression: Secondary | ICD-10-CM

## 2021-05-17 NOTE — Telephone Encounter (Signed)
Mom reports feeling good physically. No concerns about herself at this time. EPDS=12  (No hospital score) Mom agrees she would like a referral to [facility] for guidance with anxiety and sadness. Referral made and PMAD resource list & support group emailed.  Mom reports baby is doing well. Feeding, peeing, and pooping without difficulty. Safe sleep reviewed. Mom reports no concerns about baby at present.  Duffy Rhody, RN 05-17-2021 at 2:37pm

## 2021-05-18 ENCOUNTER — Ambulatory Visit (INDEPENDENT_AMBULATORY_CARE_PROVIDER_SITE_OTHER): Payer: Medicaid Other

## 2021-05-18 ENCOUNTER — Other Ambulatory Visit: Payer: Self-pay

## 2021-05-18 VITALS — BP 121/76 | HR 83

## 2021-05-18 DIAGNOSIS — Z0130 Encounter for examination of blood pressure without abnormal findings: Secondary | ICD-10-CM

## 2021-05-18 NOTE — Progress Notes (Signed)
Here today for blood pressure check following vaginal delivery on 05/04/21. Patient was diagnosed with severe preeclampsia. BP returned to normal prior to discharge, not discharged on any meds. Patient reports intermittent headache since discharge that sometimes improves with Tylenol or Excedrin. BP today is 121/76. Reviewed with Edd Arbour, CNM who recommends patient return in 1 week for repeat BP check. Instructed pt to check BP at home with any s/s of elevated BP. Patient to contact the office with any BP 140/90 or higher.   Hemoglobin was 7.8 on 05/05/21. Given 500 mg Venofer prior to discharge. Patient reports not taking iron supplement prescribed at discharge. Encouraged pt to locate this at home and begin taking as prescribed. Pt will contact the office if unable to locate iron supplement.   Fleet Contras RN 05/18/21

## 2021-05-18 NOTE — Patient Instructions (Signed)
Constipation:  Colace  Ducolax suppositories  Fleet enema  Glycerin suppositories  Metamucil  Milk of magnesia  Miralax  Senokot  Smooth move tea

## 2021-05-19 NOTE — Progress Notes (Signed)
Patient was assessed and managed by nursing staff during this encounter. I have reviewed the chart and agree with the documentation and plan.   Edd Arbour, MSN, CNM, IBCLC 05/19/21 1:48 PM

## 2021-05-25 ENCOUNTER — Ambulatory Visit: Payer: Medicaid Other

## 2021-05-26 ENCOUNTER — Telehealth: Payer: Self-pay | Admitting: Clinical

## 2021-05-26 NOTE — Telephone Encounter (Signed)
Attempt to schedule, per referral; Left HIPPA-compliant message to call back Keyia Moretto from [facility] at  [phone] (Gabbie Marzo's office); left MyChart message for pt. °

## 2021-06-16 ENCOUNTER — Other Ambulatory Visit: Payer: Self-pay | Admitting: General Practice

## 2021-06-16 ENCOUNTER — Other Ambulatory Visit: Payer: Medicaid Other

## 2021-06-16 ENCOUNTER — Other Ambulatory Visit: Payer: Self-pay

## 2021-06-16 ENCOUNTER — Ambulatory Visit (INDEPENDENT_AMBULATORY_CARE_PROVIDER_SITE_OTHER): Payer: Medicaid Other | Admitting: Obstetrics & Gynecology

## 2021-06-16 DIAGNOSIS — Z8632 Personal history of gestational diabetes: Secondary | ICD-10-CM

## 2021-06-16 DIAGNOSIS — R32 Unspecified urinary incontinence: Secondary | ICD-10-CM | POA: Diagnosis not present

## 2021-06-16 DIAGNOSIS — Z3202 Encounter for pregnancy test, result negative: Secondary | ICD-10-CM

## 2021-06-16 LAB — POCT PREGNANCY, URINE: Preg Test, Ur: NEGATIVE

## 2021-06-16 MED ORDER — DOCUSATE SODIUM 100 MG PO CAPS: 100.0000 mg | ORAL_CAPSULE | Freq: Two times a day (BID) | ORAL | 2 refills | Status: DC | PRN

## 2021-06-16 NOTE — Progress Notes (Signed)
Post Partum Visit Note  Cheryl Whitney is a 25 y.o. G25P0101 female who presents for a postpartum visit. She is 6 weeks and 1 day postpartum following a normal spontaneous vaginal delivery.  I have fully reviewed the prenatal and intrapartum course. The delivery was at 36 weeks and 6 days gestational weeks.  Anesthesia: epidural. Postpartum course has been going well. Baby is doing well. Baby is feeding by bottle - Similac Neosure. Bleeding staining only. Bowel function is abnormal, patient complains of constipation. Bladder function is abnormal, patient complains of incontinence. Patient is sexually active. Contraception method is none. Postpartum depression screening: negative.   The pregnancy intention screening data noted above was reviewed. Potential methods of contraception were discussed. The patient elected to proceed with No data recorded.   Edinburgh Postnatal Depression Scale - 06/16/21 0916       Edinburgh Postnatal Depression Scale:  In the Past 7 Days   I have been able to laugh and see the funny side of things. 0    I have looked forward with enjoyment to things. 0    I have blamed myself unnecessarily when things went wrong. 0    I have been anxious or worried for no good reason. 0    I have felt scared or panicky for no good reason. 2    Things have been getting on top of me. 2    I have been so unhappy that I have had difficulty sleeping. 0    I have felt sad or miserable. 0    I have been so unhappy that I have been crying. 0    The thought of harming myself has occurred to me. 0    Edinburgh Postnatal Depression Scale Total 4             Health Maintenance Due  Topic Date Due   COVID-19 Vaccine (1) Never done   Pneumococcal Vaccine 67-51 Years old (1 - PCV) Never done   URINE MICROALBUMIN  Never done   HPV VACCINES (1 - 2-dose series) Never done   TETANUS/TDAP  Never done    The following portions of the patient's history were reviewed and  updated as appropriate: allergies, current medications, past family history, past medical history, past social history, past surgical history, and problem list.  Review of Systems Pertinent items are noted in HPI.  Objective:  Blood pressure 130/72, pulse 94, weight 184 lb 4.8 oz (83.6 kg), not currently breastfeeding.   General:  alert, cooperative, and no distress   Breasts:    Lungs: Effort normal  Heart:  regular rate and rhythm  Abdomen: soft, non-tender; bowel sounds normal; no masses,  no organomegaly   Wound   GU exam:  not indicated       Assessment:    There are no diagnoses linked to this encounter.  normal postpartum exam.   Plan:   Essential components of care per ACOG recommendations:  1.  Mood and well being: Patient with negative depression screening today. Reviewed local resources for support.  - Patient tobacco use? No.   - hx of drug use? No.    2. Infant care and feeding:  -Patient currently breastmilk feeding? No.  -Social determinants of health (SDOH) reviewed in EPIC. No concerns  3. Sexuality, contraception and birth spacing - Patient does not want a pregnancy in the next year.  Desired family size is 2 children.  - Reviewed forms of contraception in tiered fashion. Patient desired  IUD today.   - Discussed birth spacing of 18 months  4. Sleep and fatigue -Encouraged family/partner/community support of 4 hrs of uninterrupted sleep to help with mood and fatigue  5. Physical Recovery  - Discussed patients delivery and complications. She describes her labor as good. - Patient had a Vaginal, no problems at delivery. Patient had a 1st degree laceration. Perineal healing reviewed. Patient expressed understanding - Patient has urinary incontinence? Yes. Discussed role of pelvic floor PT. Offered PT and patient accepted. Patient was referred to pelvic floor PT.  - Patient is safe to resume physical and sexual activity  6.  Health Maintenance - HM due  items addressed Yes - Last pap smear No results found for: DIAGPAP Pap smear not done at today's visit.  10/25/20 0000   Result status: Final  Reference range: Negative for intraephithelial lesion or malignancy, Other  Value: Negative for intraephithelial lesion or malignancy   -Breast Cancer screening indicated? No.   7. Chronic Disease/Pregnancy Condition follow up: Gestational Diabetes  - PCP follow up  Scheryl Darter, MD [facility]

## 2021-06-27 ENCOUNTER — Other Ambulatory Visit: Payer: Medicaid Other

## 2021-06-27 ENCOUNTER — Other Ambulatory Visit: Payer: Self-pay

## 2021-06-27 DIAGNOSIS — Z8632 Personal history of gestational diabetes: Secondary | ICD-10-CM

## 2021-06-28 LAB — GLUCOSE TOLERANCE, 2 HOURS
Glucose, 2 hour: 74 mg/dL (ref 70–139)
Glucose, GTT - Fasting: 80 mg/dL (ref 70–99)

## 2021-07-04 ENCOUNTER — Ambulatory Visit: Payer: Self-pay

## 2021-07-04 ENCOUNTER — Telehealth: Payer: Self-pay

## 2021-07-04 NOTE — Telephone Encounter (Signed)
PT called and left message. Asked pt to return to call in order to reschedule. Encouraged that PT would be happy to answer any questions she had about pelvic health evaluation and treatment before appointment time.

## 2021-07-05 ENCOUNTER — Encounter: Payer: Self-pay | Admitting: Nurse Practitioner

## 2021-07-06 ENCOUNTER — Ambulatory Visit: Payer: Medicaid Other | Admitting: Nurse Practitioner

## 2021-10-03 ENCOUNTER — Encounter: Payer: Self-pay | Admitting: Obstetrics and Gynecology

## 2021-10-03 ENCOUNTER — Ambulatory Visit (INDEPENDENT_AMBULATORY_CARE_PROVIDER_SITE_OTHER): Payer: Medicaid Other | Admitting: Obstetrics and Gynecology

## 2021-10-03 DIAGNOSIS — Z30017 Encounter for initial prescription of implantable subdermal contraceptive: Secondary | ICD-10-CM | POA: Diagnosis not present

## 2021-10-03 LAB — POCT PREGNANCY, URINE: Preg Test, Ur: NEGATIVE

## 2021-10-03 MED ORDER — ETONOGESTREL 68 MG ~~LOC~~ IMPL
68.0000 mg | DRUG_IMPLANT | Freq: Once | SUBCUTANEOUS | Status: AC
  Administered 2021-10-03: 68 mg via SUBCUTANEOUS

## 2021-10-03 NOTE — Progress Notes (Signed)
? ? ? ?  GYNECOLOGY CLINIC PROCEDURE NOTE ? ?Cheryl Whitney is a 26 y.o. G1P0101 here for Nexplanon insertion.   ? ?Nexplanon Insertion Procedure ?Patient identified, informed consent performed, consent signed.   Patient does understand that irregular bleeding is a very common side effect of this medication. She was advised to have backup contraception for one week after placement. Pregnancy test in clinic today was negative.  Appropriate time out taken.  Patient's left arm was prepped and draped in the usual sterile fashion.. The ruler used to measure and mark insertion area.  Patient was prepped with alcohol swab and then injected with 3 ml of 1% lidocaine.  She was prepped with betadine, Nexplanon removed from packaging,  Device confirmed in needle, then inserted full length of needle and withdrawn per handbook instructions. Nexplanon was able to palpated in the patient's arm; patient palpated the insert herself. There was minimal blood loss.  Patient insertion site covered with guaze and a pressure bandage to reduce any bruising.  The patient tolerated the procedure well and was given post procedure instructions.  ? ? ?Nettie Elm, MD, FACOG ?Attending Obstetrician & Gynecologist ?[facility] [employer] ? ?  ?

## 2021-10-03 NOTE — Addendum Note (Signed)
Addended by: Bethanne Ginger on: 10/03/2021 09:52 AM ? ? Modules accepted: Orders ? ?

## 2021-10-03 NOTE — Patient Instructions (Signed)
Nexplanon Instructions After Insertion  Keep bandage clean and dry for 24 hours  May use ice/Tylenol/Ibuprofen for soreness or pain  If you develop fever, drainage or increased warmth from incision site-contact office immediately   

## 2021-10-15 ENCOUNTER — Encounter (HOSPITAL_COMMUNITY): Payer: Self-pay

## 2021-10-15 ENCOUNTER — Ambulatory Visit (HOSPITAL_COMMUNITY)
Admission: EM | Admit: 2021-10-15 | Discharge: 2021-10-15 | Disposition: A | Discharge status: Home / Self Care | Payer: Medicaid Other | Attending: Urgent Care | Admitting: Urgent Care

## 2021-10-15 DIAGNOSIS — R21 Rash and other nonspecific skin eruption: Secondary | ICD-10-CM | POA: Diagnosis not present

## 2021-10-15 MED ORDER — TRIAMCINOLONE ACETONIDE 0.5 % EX OINT: 1.0000 "application " | TOPICAL_OINTMENT | Freq: Two times a day (BID) | CUTANEOUS | 0 refills | Status: DC

## 2021-10-15 MED ORDER — PREDNISONE 10 MG (21) PO TBPK: ORAL_TABLET | Freq: Every day | ORAL | 0 refills | Status: DC

## 2021-10-15 NOTE — ED Triage Notes (Signed)
Pt presents with rash on both hands and face that is very itchy & burns that has been progressing over 3 weeks. ?

## 2021-10-15 NOTE — Discharge Instructions (Addendum)
Start using the topical triamcinolone to your hands twice daily.  Do not occlude. Do not exceed 14 consecutive days of use. ?Use the oral prednisone to help with the rash on your face and scalp. ?Purchase [employer] working [employer], and use at nighttime. ?Follow-up with PCP in 2 weeks to reassess ?

## 2021-10-15 NOTE — ED Provider Notes (Signed)
?[facility] ? ? ? ?CSN: [id number] ?Arrival date & time: 10/15/21  1545 ? ? ?  ? ?History   ?Chief Complaint ?Chief Complaint  ?Patient presents with  ? Rash  ? ? ?HPI ?Cheryl Whitney is a 26 y.o. female.  ? ?Pleasant 26 year old female with a noncontributory past medical history presents today with a 2 to 3-week history of an itchy rash primarily on her hands.  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  The rash is itchy, and has started to ooze clear liquid.  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  She used over-the-counter creams without resolution to her symptoms.  She states however this morning she started noticing the rash on her face and the back of her neck as well.  Denies any known cause and denies any change to her usual regimen. No recent travel. ? ? ?Rash ? ?Past Medical History:  ?Diagnosis Date  ? Gestational diabetes   ? Hearing loss in right ear   ? Medical history non-contributory   ? ? ?Patient Active Problem List  ? Diagnosis Date Noted  ? Nexplanon insertion 10/03/2021  ? History of gestational diabetes 04/06/2021  ? 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 ? ?Past Surgical History:  ?Procedure Laterality Date  ? NO PAST SURGERIES    ? ? ?OB History   ? ? Gravida  ?1  ? Para  ?1  ? Term  ?   ? Preterm  ?1  ? AB  ?   ? Living  ?1  ?  ? ? SAB  ?   ? IAB  ?   ? Ectopic  ?   ? Multiple  ?0  ? Live Births  ?1  ?   ?  ?  ? ? ? ?Home Medications   ? ?Prior to Admission medications   ?Medication Sig Start Date End Date Taking? Authorizing Provider  ?predniSONE (STERAPRED UNI-PAK 21 TAB) 10 MG (21) TBPK tablet Take by mouth daily. Take 6 tabs by mouth daily  for 1 days, then 5 tabs for 1 days, then 4 tabs for 1 days, then 3 tabs for 1 days, 2 tabs for 1 days, then 1 tab by mouth daily for 1 days 10/15/21  Yes Kingstyn Deruiter L, PA  ?triamcinolone ointment (KENALOG) 0.5 % Apply 1 application. topically 2 (two)  times daily. 10/15/21  Yes Georgian Mcclory L, PA  ?acetaminophen (TYLENOL) 500 MG tablet Take 2 tablets (1,000 mg total) by mouth every 8 (eight) hours as needed for moderate pain. 11/22/20 11/22/21  Gita Kudo, MD  ?Aspirin-Acetaminophen-Caffeine (EXCEDRIN PO) Take by mouth.    [provider]  ?Multiple Vitamin (MULTIVITAMIN WITH MINERALS) TABS tablet Take 1 tablet by mouth daily.    [provider]  ? ? ?Family History ?Family History  ?Problem Relation Age of Onset  ? Healthy Mother   ? Healthy Father   ? ? ?Social History ?Social History  ? ?Tobacco Use  ? Smoking status: Never  ? Smokeless tobacco: Never  ?Vaping Use  ? Vaping Use: Never used  ?Substance Use Topics  ? Alcohol use: Not Currently  ? Drug use: Never  ? ? ? ?Allergies   ?Patient has no known allergies. ? ? ?Review of Systems ?Review of Systems  ?Skin:  Positive for rash.  ? ? ?Physical Exam ?Triage Vital Signs ?ED Triage Vitals  ?Enc Vitals Group  ?  BP 10/15/21 1700 116/75  ?   Pulse Rate 10/15/21 1700 81  ?   Resp 10/15/21 1700 18  ?   Temp 10/15/21 1700 98.5 ?F (36.9 ?C)  ?   Temp Source 10/15/21 1700 Oral  ?   SpO2 10/15/21 1700 97 %  ?   Weight --   ?   Height --   ?   Head Circumference --   ?   Peak Flow --   ?   Pain Score 10/15/21 1703 3  ?   Pain Loc --   ?   Pain Edu? --   ?   Excl. in [place]? --   ? ?No data found. ? ?Updated Vital Signs ?BP 116/75 (BP Location: Right Arm)   Pulse 81   Temp 98.5 ?F (36.9 ?C) (Oral)   Resp 18   LMP 09/24/2021   SpO2 97%  ? ?Visual Acuity ?Right Eye Distance:   ?Left Eye Distance:   ?Bilateral Distance:   ? ?Right Eye Near:   ?Left Eye Near:    ?Bilateral Near:    ? ?Physical Exam ?Vitals and nursing note reviewed.  ?Constitutional:   ?   General: She is not in acute distress. ?   Appearance: Normal appearance. She is obese. She is not ill-appearing, toxic-appearing or diaphoretic.  ?HENT:  ?   Head: Normocephalic and atraumatic.  ?Skin: ?   General: Skin is warm.  ?   Capillary  Refill: Capillary refill takes less than 2 seconds.  ?   Findings: Rash present. Rash is scaling.  ?   Nails: There is no clubbing.  ?   Comments: Several scaling plaques noted to bilateral hands, primarily to the dorsal aspect. Several fissures noted, clear fluid from fissure. ? ?Scattered papules noted to face, primarily around the R temple and maxillary area, generalized across nape of neck.  ?Neurological:  ?   Mental Status: She is alert.  ? ? ? ?UC Treatments / Results  ?Labs ?(all labs ordered are listed, but only abnormal results are displayed) ?Labs Reviewed - No data to display ? ?EKG ? ? ?Radiology ?No results found. ? ?Procedures ?Procedures (including critical care time) ? ?Medications Ordered in UC ?Medications - No data to display ? ?Initial Impression / Assessment and Plan / UC Course  ?I have reviewed the triage vital signs and the nursing notes. ? ?Pertinent labs & imaging results that were available during my care of the patient were reviewed by me and considered in my medical decision making (see chart for details). ? ?  ? 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 Will do topical steroid creams to hands, PO steroids for face and neck. [place] to hands at night, refer to PCP for follow up. ER precautions reviewed. ? ?Final Clinical Impressions(s) / UC Diagnoses  ? ?Final diagnoses:  ?Rash and nonspecific skin eruption  ? ? ? ?Discharge Instructions   ? ?  ?Start using the topical triamcinolone to your hands twice daily.  Do not occlude. Do not exceed 14 consecutive days of use. ?Use the oral prednisone to help with the rash on your face and scalp. ?Purchase [employer]'s working Chief Technology Officerhands green circular tub, and use at nighttime. ?Follow-up with PCP in 2 weeks to reassess ? ? ? ? ?ED Prescriptions   ? ? Medication Sig Dispense Auth. Provider  ? triamcinolone ointment (KENALOG) 0.5 % Apply 1 application.  topically  2 (two) times daily. 30 g Diani Jillson L, PA  ? predniSONE (STERAPRED UNI-PAK 21 TAB) 10 MG (21) TBPK tablet Take by mouth daily. Take 6 tabs by mouth daily  for 1 days, then 5 tabs for 1 days, then 4 tabs for 1 days, then 3 tabs for 1 days, 2 tabs for 1 days, then 1 tab by mouth daily for 1 days 21 tablet Winnell Bento L, PA  ? ?  ? ?PDMP not reviewed this encounter. ?  Maretta Bees, [place] ?10/15/21 1826 ? ?

## 2021-10-21 ENCOUNTER — Encounter (HOSPITAL_COMMUNITY): Payer: Self-pay

## 2022-02-22 ENCOUNTER — Other Ambulatory Visit: Payer: Self-pay | Admitting: Family Medicine

## 2022-02-22 DIAGNOSIS — Z8742 Personal history of other diseases of the female genital tract: Secondary | ICD-10-CM

## 2022-02-22 DIAGNOSIS — R1032 Left lower quadrant pain: Secondary | ICD-10-CM

## 2022-02-22 DIAGNOSIS — R10813 Right lower quadrant abdominal tenderness: Secondary | ICD-10-CM

## 2022-02-27 ENCOUNTER — Ambulatory Visit
Admission: RE | Admit: 2022-02-27 | Discharge: 2022-02-27 | Disposition: A | Discharge status: Home / Self Care | Payer: Medicaid Other | Source: Ambulatory Visit | Attending: Family Medicine | Admitting: Family Medicine

## 2022-02-27 DIAGNOSIS — Z8742 Personal history of other diseases of the female genital tract: Secondary | ICD-10-CM

## 2022-02-27 DIAGNOSIS — R10813 Right lower quadrant abdominal tenderness: Secondary | ICD-10-CM

## 2022-02-27 DIAGNOSIS — R1032 Left lower quadrant pain: Secondary | ICD-10-CM

## 2022-03-25 ENCOUNTER — Encounter (HOSPITAL_COMMUNITY): Payer: Self-pay

## 2022-03-25 ENCOUNTER — Emergency Department (HOSPITAL_COMMUNITY)
Admission: EM | Admit: 2022-03-25 | Discharge: 2022-03-25 | Discharge status: Against Medical Advice | Payer: Medicaid Other | Attending: Emergency Medicine | Admitting: Emergency Medicine

## 2022-03-25 ENCOUNTER — Other Ambulatory Visit: Payer: Self-pay

## 2022-03-25 ENCOUNTER — Ambulatory Visit
Admission: EM | Admit: 2022-03-25 | Discharge: 2022-03-25 | Disposition: A | Discharge status: Home / Self Care | Payer: Medicaid Other | Attending: Emergency Medicine | Admitting: Emergency Medicine

## 2022-03-25 DIAGNOSIS — H01134 Eczematous dermatitis of left upper eyelid: Secondary | ICD-10-CM

## 2022-03-25 DIAGNOSIS — L309 Dermatitis, unspecified: Secondary | ICD-10-CM | POA: Diagnosis not present

## 2022-03-25 DIAGNOSIS — Z5321 Procedure and treatment not carried out due to patient leaving prior to being seen by health care provider: Secondary | ICD-10-CM | POA: Insufficient documentation

## 2022-03-25 DIAGNOSIS — L301 Dyshidrosis [pompholyx]: Secondary | ICD-10-CM | POA: Diagnosis not present

## 2022-03-25 DIAGNOSIS — T7840XA Allergy, unspecified, initial encounter: Secondary | ICD-10-CM | POA: Diagnosis present

## 2022-03-25 DIAGNOSIS — H01131 Eczematous dermatitis of right upper eyelid: Secondary | ICD-10-CM | POA: Diagnosis not present

## 2022-03-25 MED ORDER — TRIAMCINOLONE ACETONIDE 0.025 % EX CREA
1.0000 | TOPICAL_CREAM | Freq: Two times a day (BID) | CUTANEOUS | 0 refills | Status: AC
Start: 2022-03-25 — End: ?

## 2022-03-25 MED ORDER — EUCRISA 2 % EX OINT: 1.0000 | TOPICAL_OINTMENT | Freq: Two times a day (BID) | CUTANEOUS | 1 refills | Status: DC

## 2022-03-25 MED ORDER — METHYLPREDNISOLONE 4 MG PO TBPK: ORAL_TABLET | ORAL | 0 refills | Status: DC

## 2022-03-25 MED ORDER — EUCRISA 2 % EX OINT: 1.0000 | TOPICAL_OINTMENT | Freq: Two times a day (BID) | CUTANEOUS | 0 refills | Status: DC

## 2022-03-25 MED ORDER — MONTELUKAST SODIUM 10 MG PO TABS: 10.0000 mg | ORAL_TABLET | Freq: Every day | ORAL | 1 refills | Status: DC

## 2022-03-25 MED ORDER — CETIRIZINE HCL 10 MG PO TABS: 10.0000 mg | ORAL_TABLET | Freq: Every day | ORAL | 1 refills | Status: DC

## 2022-03-25 NOTE — ED Triage Notes (Signed)
Ambulatory to ED with c/o allergic reaction. C/o rash and shortness of breath. Thinks its a reaction to her nexplanon that she got placed in April. States she's been seen multiple times by PCP and UC since having her nexplanon placed.

## 2022-03-25 NOTE — ED Triage Notes (Addendum)
The pt states she has been having generalized rashes/ hives (face, neck, arms and fingers). The patient states the prescribed medication she was taking has not helped. The patient states ever since having the nexplanon placed she has had this issue.  Started: about two months ago

## 2022-03-25 NOTE — Discharge Instructions (Addendum)
I have included information about dyshidrotic eczema (pompholyx) that I hope you find helpful.  Eczema is a chronic, recurring, medical problem that affects many people every day.  While we are considering a small children sometimes patients do not develop symptoms until adulthood.  I recommend the following for rapid relief of your rash:   Please begin taking methylprednisolone tomorrow morning by taking 1 full row tablets with your morning meal.  It is important that you take this medication with food.  Please repeat daily until all results have been taken.  Please apply Eucrisa ointment to all affected areas on your hands, arms and neck twice daily until rash is resolved.  Cheryl Whitney is not a steroid so it will not reach her skin.  After applying Eucrisa ointment and allowing it to be absorbed, please apply Eucerin original healing cream to the rash as well.  Apply Eucerin twice daily as well.  Discontinue once rash is resolved.  You can resume use if you feel the rash is starting to recur and then again discontinue once it is resolved.  Please apply triamcinolone 0.025% cream to to your eyelids twice daily until the rash on your eyelids has sufficiently resolved, then discontinue.  Strength of the steroid is 200 times less potent than the triamcinolone cream that you were prescribed several months ago.  It is safe to use on eyelids and will not reach her skin.  I recommend the following to prevent the rash from reoccurring:  Zyrtec (cetirizine): This is an excellent second-generation antihistamine that helps to reduce inflammation of the skin which can sometimes be caused by external irritants but can also be caused by your body unnecessarily producing too much histamine.  In some patients, this medication can cause daytime sleepiness so I recommend that you take 1 tablet daily at bedtime.     Singulair (montelukast): This is a mast cell stabilizer that works well with antihistamines.  Mast cells  are responsible for stimulating histamine production so you can imagine that if we can reduce the activity of your mast cells, then fewer histamines will be produced and inflammation caused by allergy exposure will be significantly reduced.  I recommend that you take this medication at the same time you take your antihistamine.   Regular twice daily use of 7 moisturizer designed for extra dry skin such as products made by Cetaphil, CeraVe or Eucerin.  Be sure you to use products that are unscented and labeled "hypoallergenic".  You may also wish to consider purchasing soft cotton gloves so that you can apply a thick coat of Eucerin at bedtime and cover your hands with the swelling you are sleeping at night.  Alternately, soft cotton socks will also work well.  With the exception of the moisturizer, I have sent prescriptions for all of the above medications to your pharmacy.  If the above recommendations do not resolve your rash and prevent it from reoccurring, recheck your gynecologist about having the Nexplanon removed.  While I do think that some people do have allergic reaction to things that are implanted in the bodies, I also think that the rash to your extremities this time is probably due to eczema.  Minutes to an allergic reaction.

## 2022-03-25 NOTE — ED Provider Notes (Signed)
[facility]    CSN: [id number] Arrival date & time: 03/25/22  1300    HISTORY   Chief Complaint  Patient presents with   Urticaria   HPI Cheryl Whitney is a pleasant, 26 y.o. female who presents to urgent care today. The pt states she has been having generalized rashes/ hives (face, neck, arms and fingers). The patient states the prescribed medication she was taking has not helped. The patient states ever since having the nexplanon placed she has had this issue.   The history is provided by the patient.  Rash Location:  Shoulder/arm, head/neck and face Head/neck rash location:  L neck and R neck Facial rash location:  L eyelid and R eyelid Shoulder/arm rash location:  L wrist, R wrist, L hand and R hand Quality: burning, dryness, itchiness, peeling, redness and scaling   Quality: not blistering, not bruising, not draining, not painful, not swelling and not weeping   Severity:  Moderate Onset quality:  Gradual Duration:  2 months Timing:  Intermittent Progression:  Waxing and waning Chronicity:  Chronic Context: not animal contact, not chemical exposure, not diapers, not eggs, not exposure to similar rash, not food, not hot tub use, not insect bite/sting, not medications, not new detergent/soap, not nuts, not plant contact, not pollen, not pregnancy, not sick contacts and not sun exposure   Relieved by:  Topical steroids and moisturizers Worsened by:  Moisture Ineffective treatments:  None tried Associated symptoms: no abdominal pain, no diarrhea, no fatigue, no fever, no headaches, no hoarse voice, no induration, no joint pain, no myalgias, no nausea, no periorbital edema, no shortness of breath, no sore throat, no throat swelling, no tongue swelling, no URI, not vomiting and not wheezing    Past Medical History:  Diagnosis Date   Gestational diabetes    Hearing loss in right ear    Medical history non-contributory    Patient Active Problem List    Diagnosis Date Noted   Nexplanon insertion 10/03/2021   History of gestational diabetes 04/06/2021   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 Authorizing Provider  cetirizine (ZYRTEC ALLERGY) 10 MG tablet Take 1 tablet (10 mg total) by mouth at bedtime. 03/25/22 09/21/22 Yes Lynden Oxford Scales, PA-C  methylPREDNISolone (MEDROL DOSEPAK) 4 MG TBPK tablet Take 24 mg on day 1, 20 mg on day 2, 16 mg on day 3, 12 mg on day 4, 8 mg on day 5, 4 mg on day 6.  Take all tablets in each row at once, do not spread tablets out throughout the day. 03/25/22  Yes Lynden Oxford Scales, PA-C  montelukast (SINGULAIR) 10 MG tablet Take 1 tablet (10 mg total) by mouth at bedtime. 03/25/22 09/21/22 Yes Lynden Oxford Scales, PA-C  triamcinolone (KENALOG) 0.025 % cream Apply 1 Application topically 2 (two) times daily. Apply to affected area(s) on  face twice daily, discontinue once resolved. 03/25/22  Yes Lynden Oxford Scales, PA-C  Aspirin-Acetaminophen-Caffeine (EXCEDRIN PO) Take by mouth.    [provider]  EUCRISA 2 % OINT Apply 1 Application topically in the morning and at bedtime. 03/25/22   Lynden Oxford Scales, PA-C  Multiple Vitamin (MULTIVITAMIN WITH MINERALS) TABS tablet Take 1 tablet by mouth daily.    [provider]    Family History Family History  Problem Relation Age of Onset   Healthy Mother    Healthy Father    Social History Social History   Tobacco Use   Smoking status: Never   Smokeless tobacco: Never  Vaping Use   Vaping Use: Never used  Substance Use Topics   Alcohol use: Not Currently   Drug use:  Never   Allergies   Patient has no known allergies.  Review of Systems Review of Systems  Constitutional:  Negative for fatigue and fever.  HENT:  Negative for hoarse voice and sore throat.   Respiratory:  Negative for shortness of breath and wheezing.   Gastrointestinal:  Negative for abdominal pain, diarrhea, nausea and vomiting.  Musculoskeletal:  Negative for arthralgias and myalgias.  Skin:  Positive for rash.  Neurological:  Negative for headaches.   Pertinent findings revealed after performing a 14 point review of systems has been noted in the history of present illness.  Physical Exam Triage Vital Signs ED Triage Vitals  Enc Vitals Group     BP 04/15/21 0827 (!) 147/82     Pulse Rate 04/15/21 0827 72     Resp 04/15/21 0827 18     Temp 04/15/21 0827 98.3 F (36.8 C)     Temp Source 04/15/21 0827 Oral     SpO2 04/15/21 0827 98 %     Weight --      Height --      Head Circumference --      Peak Flow --      Pain Score 04/15/21 0826 5     Pain Loc --      Pain Edu? --      Excl. in [place]? --   No data found.  Updated Vital Signs BP 103/70 (BP Location: Right Arm)   Pulse 76   Temp 97.7 F (36.5 C) (Oral)   Resp 18   LMP 03/22/2022   SpO2 96%   Breastfeeding No   Physical Exam Vitals and nursing note reviewed.  Constitutional:      General: She is not in acute distress.    Appearance: Normal appearance.  HENT:     Head: Normocephalic and atraumatic.  Eyes:     Pupils: Pupils are equal, round, and reactive to light.  Cardiovascular:     Rate and Rhythm: Normal rate and regular rhythm.  Pulmonary:     Effort: Pulmonary effort is normal.     Breath sounds: Normal breath sounds.  Musculoskeletal:        General: Normal range of motion.     Cervical back: Normal range of motion and neck supple.  Skin:    General: Skin is warm and dry.     Findings: Rash (Dyshidrotic eczema on hands, forearms face and neck, eyelids above and below) present.  Neurological:      General: No focal deficit present.     Mental Status: She is alert and oriented to person, place, and time. Mental status is at baseline.  Psychiatric:        Mood and Affect:  Mood normal.        Behavior: Behavior normal.        Thought Content: Thought content normal.        Judgment: Judgment normal.     Visual Acuity Right Eye Distance:   Left Eye Distance:   Bilateral Distance:    Right Eye Near:   Left Eye Near:    Bilateral Near:     UC Couse / Diagnostics / Procedures:     Radiology No results found.  Procedures Procedures (including critical care time) EKG  Pending results:  Labs Reviewed - No data to display  Medications Ordered in UC: Medications - No data to display  UC Diagnoses / Final Clinical Impressions(s)   I have reviewed the triage vital signs and the nursing notes.  Pertinent labs & imaging results that were available during my care of the patient were reviewed by me and considered in my medical decision making (see chart for details).    Final diagnoses:  Eczematous dermatitis of upper eyelids of both eyes  Eczema of both hands  Dyshidrosis (pompholyx)   Patient provided with education about dyshidrotic eczema.  Patient advised that presence of the Nexplanon is likely the underlying cause of her eczema.  Patient advised to try recommended regimen for at least 2 months before reaching out to gynecology to have it removed.  Recommend another short course of steroids, topical steroids, Eucrisa and consistent moisturization.  Return precautions advised.  ED Prescriptions     Medication Sig Dispense Auth. Provider   methylPREDNISolone (MEDROL DOSEPAK) 4 MG TBPK tablet Take 24 mg on day 1, 20 mg on day 2, 16 mg on day 3, 12 mg on day 4, 8 mg on day 5, 4 mg on day 6.  Take all tablets in each row at once, do not spread tablets out throughout the day. 21 tablet Lynden Oxford Scales, PA-C   triamcinolone (KENALOG) 0.025 % cream Apply 1  Application topically 2 (two) times daily. Apply to affected area(s) on face twice daily, discontinue once resolved. 15 g Lynden Oxford Scales, PA-C   EUCRISA 2 % OINT Apply 1 Application topically in the morning and at bedtime. 100 g Lynden Oxford Scales, PA-C   cetirizine (ZYRTEC ALLERGY) 10 MG tablet Take 1 tablet (10 mg total) by mouth at bedtime. 90 tablet Lynden Oxford Scales, PA-C   montelukast (SINGULAIR) 10 MG tablet Take 1 tablet (10 mg total) by mouth at bedtime. 90 tablet Lynden Oxford Scales, [place]      PDMP not reviewed this encounter.  Pending results:  Labs Reviewed - No data to display  Discharge Instructions:   Discharge Instructions      I have included information about dyshidrotic eczema (pompholyx) that I hope you find helpful.  Eczema is a chronic, recurring, medical problem that affects many people every day.  While we are considering a small children sometimes patients do not develop symptoms until adulthood.  I recommend the following for rapid relief of your rash:   Please begin taking methylprednisolone tomorrow morning by taking 1 full row tablets with your morning meal.  It is important that you take this medication with food.  Please repeat daily until all results have been taken.  Please apply Eucrisa ointment to all affected areas on your hands, arms and neck twice daily until rash is resolved.  Georga Hacking is not a steroid so it will not reach her skin.  After applying Eucrisa ointment and allowing it to be absorbed,  please apply Eucerin original healing cream to the rash as well.  Apply Eucerin twice daily as well.  Discontinue once rash is resolved.  You can resume use if you feel the rash is starting to recur and then again discontinue once it is resolved.  Please apply triamcinolone 0.025% cream to to your eyelids twice daily until the rash on your eyelids has sufficiently resolved, then discontinue.  Strength of the steroid is 200 times less  potent than the triamcinolone cream that you were prescribed several months ago.  It is safe to use on eyelids and will not reach her skin.  I recommend the following to prevent the rash from reoccurring:  Zyrtec (cetirizine): This is an excellent second-generation antihistamine that helps to reduce inflammation of the skin which can sometimes be caused by external irritants but can also be caused by your body unnecessarily producing too much histamine.  In some patients, this medication can cause daytime sleepiness so I recommend that you take 1 tablet daily at bedtime.     Singulair (montelukast): This is a mast cell stabilizer that works well with antihistamines.  Mast cells are responsible for stimulating histamine production so you can imagine that if we can reduce the activity of your mast cells, then fewer histamines will be produced and inflammation caused by allergy exposure will be significantly reduced.  I recommend that you take this medication at the same time you take your antihistamine.   Regular twice daily use of 7 moisturizer designed for extra dry skin such as products made by Cetaphil, CeraVe or Eucerin.  Be sure you to use products that are unscented and labeled "hypoallergenic".  You may also wish to consider purchasing soft cotton gloves so that you can apply a thick coat of Eucerin at bedtime and cover your hands with the swelling you are sleeping at night.  Alternately, soft cotton socks will also work well.  With the exception of the moisturizer, I have sent prescriptions for all of the above medications to your pharmacy.  If the above recommendations do not resolve your rash and prevent it from reoccurring, recheck your gynecologist about having the Nexplanon removed.  While I do think that some people do have allergic reaction to things that are implanted in the bodies, I also think that the rash to your extremities this time is probably due to eczema.  Minutes to an allergic  reaction.       Disposition Upon Discharge:  Condition: stable for discharge home  Patient presented with an acute illness with associated systemic symptoms and significant discomfort requiring urgent management. In my opinion, this is a condition that a prudent lay person (someone who possesses an average knowledge of health and medicine) may potentially expect to result in complications if not addressed urgently such as respiratory distress, impairment of bodily function or dysfunction of bodily organs.   Routine symptom specific, illness specific and/or disease specific instructions were discussed with the patient and/or caregiver at length.   As such, the patient has been evaluated and assessed, work-up was performed and treatment was provided in alignment with urgent care protocols and evidence based medicine.  Patient/parent/caregiver has been advised that the patient may require follow up for further testing and treatment if the symptoms continue in spite of treatment, as clinically indicated and appropriate.  Patient/parent/caregiver has been advised to return to the [facility] or PCP if no better; to PCP or the Emergency Department if new signs and symptoms develop, or if the  current signs or symptoms continue to change or worsen for further workup, evaluation and treatment as clinically indicated and appropriate  The patient will follow up with their current PCP if and as advised. If the patient does not currently have a PCP we will assist them in obtaining one.   The patient may need specialty follow up if the symptoms continue, in spite of conservative treatment and management, for further workup, evaluation, consultation and treatment as clinically indicated and appropriate.   Patient/parent/caregiver verbalized understanding and agreement of plan as discussed.  All questions were addressed during visit.  Please see discharge instructions below for further details of plan.  This office  note has been dictated using Museum/gallery curator.  Unfortunately, this method of dictation can sometimes lead to typographical or grammatical errors.  I apologize for your inconvenience in advance if this occurs.  Please do not hesitate to reach out to me if clarification is needed.      Lynden Oxford Scales, PA-C 03/26/22 [phone]

## 2022-03-26 ENCOUNTER — Telehealth: Payer: Self-pay

## 2022-03-26 ENCOUNTER — Telehealth: Payer: Self-pay | Admitting: Emergency Medicine

## 2022-03-26 MED ORDER — TRIAMCINOLONE ACETONIDE 0.1 % EX CREA: 1.0000 | TOPICAL_CREAM | Freq: Two times a day (BID) | CUTANEOUS | 2 refills | Status: AC

## 2022-03-26 NOTE — Telephone Encounter (Addendum)
Patient verification complete (name and date of birth).  Patient made aware of medications and how to take them (triamcinolone for face, Eucrisa, and new prescription for triamcinolone for rest of body [if needed]).

## 2022-03-26 NOTE — Telephone Encounter (Signed)
Attempt to call patient to make aware of medication prior authorization and new prescription. Voicemail was left.  Will attempt to call again.

## 2022-03-26 NOTE — Telephone Encounter (Signed)
Eucrisa requiring PA, Triamcinolone 1% sent instead.

## 2022-03-31 ENCOUNTER — Telehealth: Payer: Medicaid Other | Admitting: Physician Assistant

## 2022-03-31 ENCOUNTER — Encounter (HOSPITAL_COMMUNITY): Payer: Self-pay

## 2022-03-31 ENCOUNTER — Emergency Department (HOSPITAL_COMMUNITY)
Admission: EM | Admit: 2022-03-31 | Discharge: 2022-03-31 | Disposition: A | Discharge status: Home / Self Care | Payer: Medicaid Other | Attending: Emergency Medicine | Admitting: Emergency Medicine

## 2022-03-31 DIAGNOSIS — Z79899 Other long term (current) drug therapy: Secondary | ICD-10-CM | POA: Insufficient documentation

## 2022-03-31 DIAGNOSIS — T7840XA Allergy, unspecified, initial encounter: Secondary | ICD-10-CM | POA: Insufficient documentation

## 2022-03-31 DIAGNOSIS — R220 Localized swelling, mass and lump, head: Secondary | ICD-10-CM | POA: Diagnosis present

## 2022-03-31 MED ORDER — FAMOTIDINE 20 MG PO TABS: 20.0000 mg | ORAL_TABLET | Freq: Two times a day (BID) | ORAL | 0 refills | Status: DC

## 2022-03-31 MED ORDER — OLOPATADINE HCL 0.1 % OP SOLN: 1.0000 [drp] | Freq: Two times a day (BID) | OPHTHALMIC | 0 refills | Status: AC

## 2022-03-31 MED ORDER — PREDNISONE 20 MG PO TABS: 20.0000 mg | ORAL_TABLET | Freq: Every day | ORAL | 0 refills | Status: AC

## 2022-03-31 NOTE — ED Triage Notes (Signed)
Pt reports worsening swelling around her face centered around her eyes. Pt was seen last Saturday for the swelling and followed up with her pcp and they recommended she be seen at ED after seeing swelling. Pt reports she has been having trouble breathing at night pt states"I feel like there is an elephant on my chest"

## 2022-03-31 NOTE — Patient Instructions (Signed)
Cheryl Whitney, thank you for joining Mar Daring, PA-C for today's virtual visit.  While this provider is not your primary care provider (PCP), if your PCP is located in our provider database this encounter information will be shared with them immediately following your visit.  [facility] account gives you access to today's visit and all your visits, tests, and labs performed at [facility] " click here if you don't have a [facility] account or go to [email or web address]  Consent: (Patient) Cheryl Whitney provided verbal consent for this virtual visit at the beginning of the encounter.  Current Medications:  Current Outpatient Medications:    famotidine (PEPCID) 20 MG tablet, Take 1 tablet (20 mg total) by mouth 2 (two) times daily., Disp: 60 tablet, Rfl: 0   olopatadine (PATANOL) 0.1 % ophthalmic solution, Place 1 drop into both eyes 2 (two) times daily., Disp: 5 mL, Rfl: 0   predniSONE (DELTASONE) 20 MG tablet, Take 1 tablet (20 mg total) by mouth daily with breakfast for 14 days., Disp: 14 tablet, Rfl: 0   Aspirin-Acetaminophen-Caffeine (EXCEDRIN PO), Take by mouth., Disp: , Rfl:    cetirizine (ZYRTEC ALLERGY) 10 MG tablet, Take 1 tablet (10 mg total) by mouth at bedtime., Disp: 90 tablet, Rfl: 1   EUCRISA 2 % OINT, Apply 1 Application topically in the morning and at bedtime., Disp: 100 g, Rfl: 1   montelukast (SINGULAIR) 10 MG tablet, Take 1 tablet (10 mg total) by mouth at bedtime., Disp: 90 tablet, Rfl: 1   Multiple Vitamin (MULTIVITAMIN WITH MINERALS) TABS tablet, Take 1 tablet by mouth daily., Disp: , Rfl:    triamcinolone (KENALOG) 0.025 % cream, Apply 1 Application topically 2 (two) times daily. Apply to affected area(s) on face twice daily, discontinue once resolved., Disp: 15 g, Rfl: 0   triamcinolone cream (KENALOG) 0.1 %, Apply 1 Application topically 2 (two) times daily. Apply to affected area(s) twice daily , do not  apply to face., Disp: 453.6 g, Rfl: 2   Medications ordered in this encounter:  Meds ordered this encounter  Medications   olopatadine (PATANOL) 0.1 % ophthalmic solution    Sig: Place 1 drop into both eyes 2 (two) times daily.    Dispense:  5 mL    Refill:  0    Order Specific Question:   Supervising Provider    Answer:   Chase Picket [[id number]]   famotidine (PEPCID) 20 MG tablet    Sig: Take 1 tablet (20 mg total) by mouth 2 (two) times daily.    Dispense:  60 tablet    Refill:  0    Order Specific Question:   Supervising Provider    Answer:   Chase Picket [[id number]]   predniSONE (DELTASONE) 20 MG tablet    Sig: Take 1 tablet (20 mg total) by mouth daily with breakfast for 14 days.    Dispense:  14 tablet    Refill:  0    Order Specific Question:   Supervising Provider    Answer:   Chase Picket [id number]     *If you need refills on other medications prior to your next appointment, please contact your pharmacy*  Follow-Up: Call back or seek an in-person evaluation if the symptoms worsen or if the condition fails to improve as anticipated.  [place] [phone]  Other Instructions  Drug Allergy  A drug allergy happens when the body's disease-fighting system (immune system) reacts badly to  a medicine. Drug allergies range from mild to severe. A drug allergy is not the same as a medicine side effect, which is a known possible reaction to the drug. A drug allergy is also different from medicine toxicity caused by an overdose of the drug. The time of an allergic reaction varies. Symptoms often appear between 1 to 2 hours after taking the medicine; however, some allergic reactions occur 1 week or more after you are exposed to a medicine (delayed reaction). A sudden (acute), severe allergic reaction that affects multiple areas of the body is called an anaphylactic reaction (anaphylaxis). Anaphylaxis can be life-threatening. All allergic reactions to a  medicine require medical evaluation, even if the allergic reaction appears to be mild. What are the causes? This condition is caused by the immune system wrongly identifying a medication as being harmful. When this happens, the body releases proteins (antibodies) and other compounds, such as histamine, into the bloodstream. This causes swelling in certain tissues and reduces blood flow to important areas, such as the heart and lungs. Almost any medicine can cause an allergic reaction. Medicines that commonly cause allergic reactions (common allergens) include: Antibiotics, such as penicillin. Sulfa medicines (sulfonamides). Medicines that numb certain areas of the body (local anesthetics). X-ray dyes that contain iodine. Pain-relievers. This includes aspirin and NSAIDs, such as ibuprofen or naproxen sodium. Chemotherapy drugs for treating cancer. Medicines for autoimmune diseases, such as rheumatoid arthritis. What are the signs or symptoms? Common symptoms of a mild allergic reaction include: Nasal congestion. Tingling in the mouth or tongue. An itchy, red rash. Common symptoms of a severe allergic reaction include: Swelling of the face, eyes, lips, or tongue, including the back of the mouth and throat. Difficulty speaking (hoarseness) or swallowing, or making high-pitched whistling sounds, most often when you breathe out (wheezing). Itchy, red, swollen areas of skin (hives). Dizziness, light-headedness, or fainting. Anxiety or confusion. Chest tightness and fast or irregular heartbeats (palpitations). Abdominal pain, vomiting, or diarrhea. How is this diagnosed? This condition is diagnosed based on a physical exam and your history of recent exposure to one or more medicines. You may be referred for follow-up testing by a health care provider who specializes in allergies. This testing can confirm the diagnosis of a drug allergy and determine which medicines you are allergic to. Testing may  include: Skin tests. These may involve: Injecting a small amount of the possible allergen between layers of your skin (intradermal injection). Applying patches to your skin. Blood tests. Drug challenge. For this test, a health care provider gives you a small amount of a medicine in gradual doses while watching for an allergic reaction. If you are unsure of what caused your allergic reaction, your health care provider may ask you for: Information about all medicines that you take on a regular basis. The date and time of your reaction. How is this treated? There is no cure for allergies. However, an allergic reaction can be treated with: Medicines that help: Reduce pain and swelling (NSAIDs). Relieve itching and hives (antihistamines). Reduce swelling (corticosteroids). Respiratory inhalers. These are inhaled medicines that help open (dilate) the airways in your lungs. Injections of medicine that helps to relax the muscles in your airways and tighten your blood vessels (epinephrine). Severe allergic reactions, such as anaphylaxis, require immediate treatment in a hospital. You may need to be hospitalized for observation. You may also be prescribed rescue medicines, such as epinephrine. Epinephrine comes in many forms, including what is commonly called an auto-injector "pen" (pre-filled  automatic epinephrine injection device). Follow these instructions at home: If you have a severe allergy  Always keep an auto-injector pen or your anaphylaxis kit near you. This can be lifesaving if you have a severe reaction. Use your auto-injector pen or anaphylaxis kit as told by your health care provider. Make sure that you, the members of your household, and your employer know: How to use your auto-injector pen or anaphylaxis kit. How to use your auto-injector pen to give you an epinephrine injection. Replace your auto-injector pen or anaphylaxis kit immediately after use, in case you have another  reaction. Wear a medical alert bracelet or necklace that states your drug allergy, if told by your health care provider. General instructions Avoid medicines that you are allergic to. Take over-the-counter and prescription medicines only as told by your health care provider. If you were given medicines to treat your allergic reaction, do not drive until your health care provider tells you it is safe. If you have hives or a rash: Use an over-the-counter antihistamine as told by your health care provider. Apply cold, wet cloths (cold compresses) to your skin or take baths or showers in cool water. Avoid hot water. If you had tests done, it is up to you to get your test results. Ask your health care provider when your results will be ready. Tell all your health care providers that you have a drug allergy. Keep all follow-up visits This is important. Contact a health care provider if: You think that you are having a mild allergic reaction. Symptoms of an allergic reaction usually start within 1 hour after you are exposed to a medicine. You have symptoms that last more than 2 days after your reaction. You develop new signs or symptoms. Get help right away if: You needed to use epinephrine. An epinephrine injection helps to manage life-threatening allergic reactions, but you still need to go to the emergency room even if epinephrine seems to work. This is important because anaphylaxis may happen again within 72 hours (rebound anaphylaxis). If you used epinephrine to treat anaphylaxis outside of the hospital, you need additional medical care. This may include more doses of epinephrine. You develop signs or symptoms of a severe allergic reaction. These symptoms may represent a serious problem that is an emergency. Do not wait to see if the symptoms will go away. Use your auto-injector pen or anaphylaxis kit as you have been instructed, and get medical help right away. Call your local emergency services  (911 in the [place] Do not drive yourself to the hospital. Summary A drug allergy happens when the body's disease-fighting system reacts badly to a medicine. Drug allergies range from mild to severe. In some cases, an allergic reaction may be life-threatening. If you have a severe allergy, always keep an auto-injector pen or your anaphylaxis kit near you. This information is not intended to replace advice given to you by your health care provider. Make sure you discuss any questions you have with your health care provider. Document Revised: 11/15/2020 Document Reviewed: 11/15/2020 Elsevier Patient Education  [place] Eczema Dyshidrotic eczema, also known as pompholyx, is a type of eczema that causes very itchy, fluid-filled blisters (vesicles) to form on the hands and feet. It is more common before age 33, though it can affect people of any age. There is no cure, but treatment and certain lifestyle changes can help relieve symptoms. What are the causes? The cause of this condition is not known. What increases  the risk? You are more likely to develop this condition if: You wash your hands frequently. You have a personal or family history of eczema, allergies, asthma, or hay fever. You are allergic to metals, such as nickel or cobalt. You work with cement. You smoke. What are the signs or symptoms? Symptoms of this condition may affect the hands, the feet, or both. Symptoms may come and go (recur), and may include: Severe itching. This may happen before blisters appear. Blisters. These may form suddenly. In the early stages, blisters may form near the fingertips. In severe cases, blisters may grow to large blister masses (bullae). Blisters resolve in 2-3 weeks without bursting. This is followed by a dry phase in which itching eases. Pain and swelling. Cracks or long, narrow openings (fissures) in the skin. Severe dryness. Ridges on the nails. How is this  diagnosed? This condition may be diagnosed based on: Your symptoms and a physical exam. Your medical history. Skin scrapings to rule out a fungal infection. Testing a swab of fluid for bacteria (culture). Removing a small piece of skin (biopsy) to test for infection or to rule out other conditions. Skin patch tests. These tests involve using patches that contain possible allergens and placing them on your back. Your health care provider will wait a few days and then check to see if an allergic reaction occurred. These tests may be done if your health care provider suspects allergic reactions, or to rule out other types of eczema. You may be referred to a health care provider who specializes in skin conditions (dermatologist) to help diagnose and treat this condition. How is this treated? There is no cure for this condition, but treatment can help relieve symptoms. Depending on the amount and severity of the blisters, your health care provider may suggest: Avoiding allergens, irritants, or triggers that worsen symptoms. This may involve lifestyle changes, such as: Using different lotions or soaps. Avoiding hot weather or places that will cause you to sweat a lot. Managing stress with coping techniques, such as relaxation and exercise, and asking for help when you need it. Diet changes as recommended by your health care provider. Using a clean, damp towel (cool compress) to relieve symptoms. Soaking in a bath that contains a type of salt that relieves irritation (aluminum acetate soaks). Medicines, such as: Medicine taken by mouth to reduce itching (oral antihistamines). Medicine applied to the skin to reduce swelling and irritation (topical corticosteroids). Medicine that reduces the activity of the body's disease-fighting system (immunosuppressants) to treat inflammation. This may be given in severe cases. Antibiotic medicines to treat bacterial infection. Light therapy (phototherapy). This  involves shining ultraviolet (UV) light on the affected skin in order to reduce itchiness and inflammation. Follow these instructions at home: Bathing and skin care  Wash skin gently. After bathing or washing your hands, pat your skin dry. Avoid rubbing your skin. Remove all jewelry before bathing. If the skin under the jewelry stays wet, blisters may form or get worse. Apply cool compresses as told by your health care provider. To do this: Soak a clean towel in cool water. Wring out excess water until towel is damp. Place the towel over the affected skin. Leave the towel on for 20 minutes at a time, 2-3 times a day. Use mild soaps, cleansers, and lotions that do not contain dyes, perfumes, or other irritants. Keep your skin hydrated. To do this: Avoid very hot water. Take lukewarm baths or showers. Apply moisturizer within 3 minutes of bathing.  This locks in moisture. Medicines Take and apply over-the-counter and prescription medicines only as told by your health care provider. If you were prescribed an antibiotic medicine, take or apply it as told by your health care provider. Do not stop using the antibiotic even if you start to feel better. General instructions Do not use any products that contain nicotine or tobacco. These include cigarettes, chewing tobacco, and vaping devices, such as e-cigarettes. If you need help quitting, ask your health care provider. Identify and avoid triggers and allergens. Keep fingernails short to avoid breaking the skin while scratching. Use waterproof gloves to protect your hands when doing work that keeps your hands wet for a long time. Wear socks to keep your feet dry. Keep all follow-up visits. This is important. Contact a health care provider if: You have symptoms that do not go away. You have signs of infection, such as: Crusting, pus, or a bad smell. More redness, swelling, or pain. Increased warmth in the affected area. Get help right away  if: Your skin gets streaking redness with associated pain. Summary Dyshidrotic eczema, also known as pompholyx, is a type of eczema that causes very itchy, fluid-filled blisters (vesicles) to form on the hands and feet. The cause of this condition is not known. There is no cure for this condition, but treatment can help relieve symptoms. Treatment depends on the amount and severity of the blisters. Use mild soaps, cleansers, and lotions that do not contain dyes, perfumes, or other irritants. Keep your skin hydrated. This information is not intended to replace advice given to you by your health care provider. Make sure you discuss any questions you have with your health care provider. Document Revised: 03/15/2020 Document Reviewed: 03/15/2020 Elsevier Patient Education  [place].    If you have been instructed to have an in-person evaluation today at a local Urgent Care facility, please use the link below. It will take you to a list of all of our available [facility], including address, phone number and hours of operation. Please do not delay care.  [facility]  If you or a family member do not have a primary care provider, use the link below to schedule a visit and establish care. When you choose a [facility] primary care physician or advanced practice provider, you gain a long-term partner in health. Find a Primary Care Provider  Learn more about [facility]'s in-office and virtual care options: [facility]

## 2022-03-31 NOTE — Progress Notes (Signed)
Virtual Visit Consent   Cheryl Whitney, you are scheduled for a virtual visit with a [place] provider today. Just as with appointments in the office, your consent must be obtained to participate. Your consent will be active for this visit and any virtual visit you may have with one of our providers in the next 365 days. If you have a MyChart account, a copy of this consent can be sent to you electronically.  As this is a virtual visit, video technology does not allow for your provider to perform a traditional examination. This may limit your provider's ability to fully assess your condition. If your provider identifies any concerns that need to be evaluated in person or the need to arrange testing (such as labs, EKG, etc.), we will make arrangements to do so. Although advances in technology are sophisticated, we cannot ensure that it will always work on either your end or our end. If the connection with a video visit is poor, the visit may have to be switched to a telephone visit. With either a video or telephone visit, we are not always able to ensure that we have a secure connection.  By engaging in this virtual visit, you consent to the provision of healthcare and authorize for your insurance to be billed (if applicable) for the services provided during this visit. Depending on your insurance coverage, you may receive a charge related to this service.  I need to obtain your verbal consent now. Are you willing to proceed with your visit today? Cheryl Whitney has provided verbal consent on 03/31/2022 for a virtual visit (video or telephone). Mar Daring, PA-C  Date: 03/31/2022 3:03 PM  Virtual Visit via Video Note   I, Mar Daring, connected with  Cheryl Whitney  ([id number], [birth year]) on 03/31/22 at  1:15 PM EDT by a video-enabled telemedicine application and verified that I am speaking with the correct person using two  identifiers.  Location: Patient: Virtual Visit Location Patient: Home Provider: Virtual Visit Location Provider: Home Office   I discussed the limitations of evaluation and management by telemedicine and the availability of in person appointments. The patient expressed understanding and agreed to proceed.    History of Present Illness: Cheryl Whitney is a 26 y.o. who identifies as a female who was assigned female at birth, and is being seen today for eye swelling around eyelids and pain. This has been a recurrent issue since she had her Nexplanon placed in April. She has had steroids, most recently completed 6 day taper today, and is currently using Triamcinolone 1% on body, Triamcinolone 0.25% on face, taking Cetirizine 10mg  at bedtime, Montelukast 10mg  at bedtime. She has had some improvements on her body but her face seems to be worsening. Now having skin symptoms as well as eye itching, watering.  She has appt to have Nexplanon removed on 04/15/22.  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    Allergies: No Known Allergies Medications:  Current Outpatient Medications:    famotidine (PEPCID) 20 MG tablet, Take 1 tablet (20 mg total) by mouth 2 (two) times daily., Disp: 60 tablet, Rfl: 0   olopatadine (PATANOL) 0.1 % ophthalmic solution, Place 1 drop into both eyes 2 (two) times daily., Disp: 5 mL, Rfl: 0   predniSONE (DELTASONE) 20 MG tablet, Take 1 tablet (20 mg total) by  mouth daily with breakfast for 14 days., Disp: 14 tablet, Rfl: 0   Aspirin-Acetaminophen-Caffeine (EXCEDRIN PO), Take by mouth., Disp: , Rfl:    cetirizine (ZYRTEC ALLERGY) 10 MG tablet, Take 1 tablet (10 mg total) by mouth at bedtime., Disp: 90 tablet, Rfl: 1   EUCRISA 2 % OINT, Apply 1 Application topically in the morning and at bedtime., Disp: 100 g, Rfl:  1   montelukast (SINGULAIR) 10 MG tablet, Take 1 tablet (10 mg total) by mouth at bedtime., Disp: 90 tablet, Rfl: 1   Multiple Vitamin (MULTIVITAMIN WITH MINERALS) TABS tablet, Take 1 tablet by mouth daily., Disp: , Rfl:    triamcinolone (KENALOG) 0.025 % cream, Apply 1 Application topically 2 (two) times daily. Apply to affected area(s) on face twice daily, discontinue once resolved., Disp: 15 g, Rfl: 0   triamcinolone cream (KENALOG) 0.1 %, Apply 1 Application topically 2 (two) times daily. Apply to affected area(s) twice daily , do not apply to face., Disp: 453.6 g, Rfl: 2  Observations/Objective: Patient is well-developed, well-nourished in no acute distress.  Resting comfortably at home.  Head is normocephalic, atraumatic.  No labored breathing.  Speech is clear and coherent with logical content.  Patient is alert and oriented at baseline.  Eyelids are swollen with erythematous and peeling skin; Forearms and hands have vesicular lesions on erythematous bases in different stages of healing; Neck is erythematous  Assessment and Plan: 1. Allergic reaction, initial encounter - olopatadine (PATANOL) 0.1 % ophthalmic solution; Place 1 drop into both eyes 2 (two) times daily.  Dispense: 5 mL; Refill: 0 - famotidine (PEPCID) 20 MG tablet; Take 1 tablet (20 mg total) by mouth 2 (two) times daily.  Dispense: 60 tablet; Refill: 0 - predniSONE (DELTASONE) 20 MG tablet; Take 1 tablet (20 mg total) by mouth daily with breakfast for 14 days.  Dispense: 14 tablet; Refill: 0  - Continue Triamcinolone, Cetirizine, Montelukast - Add Famotidine - Add olopatadine eye drops - Prednisone 20mg  daily until appt on 04/15/22 - Cool compresses - Luke warm to cool showers - Moisturize skin well (Aquaphor, Eucerin, Lubriderm) - If symptoms continue to worsen please seek immediate in person evaluation - Keep scheduled follow up on 04/15/22  Follow Up Instructions: I discussed the assessment and treatment plan  with the patient. The patient was provided an opportunity to ask questions and all were answered. The patient agreed with the plan and demonstrated an understanding of the instructions.  A copy of instructions were sent to the patient via MyChart unless otherwise noted below.    The patient was advised to call back or seek an in-person evaluation if the symptoms worsen or if the condition fails to improve as anticipated.  Time:  I spent 18 minutes with the patient via telehealth technology discussing the above problems/concerns.    04/17/22, PA-C

## 2022-03-31 NOTE — Discharge Instructions (Addendum)
Your rash is likely a reaction from your Nexplanon.  It is important for you to have this removed as promptly as possible.  You certainly can follow-up at your scheduled appointment on 10/28 with your provider, or you may try contacting our OB/GYN for possible appointment.  Return if you develop trouble breathing, tongue swelling, abdominal cramping, wheezing, or if you have any other concerns.

## 2022-03-31 NOTE — ED Provider Notes (Signed)
[facility]-EMERGENCY DEPT Provider Note   CSN: [id number] Arrival date & time: 03/31/22  2226     History  Chief Complaint  Patient presents with   Facial Swelling    Cheryl Whitney is a 26 y.o. female.  The history is provided by the patient and medical records. No language interpreter was used.     26 year old female presenting with concerns of facial rash.  Patient states she had a birth control device (Nexplanon) placed in April of this year.  Since then she has had recurrent rash that appears on her body and her face.  She has been seen and evaluated for condition several times most recent was a week ago for which she was prescribed prednisone, steroid cream, Benadryl.  She has been taking the medication with some relief but her symptoms keep returning.  She did tried to reach out to her OB/GYN to have it removed but her next appointment is not until 2 weeks from now.  Sometimes at night she did notice some difficulty breathing but not currently.  Out of frustration of no improvement of her symptoms she decided to come to the ER tonight.  She does not endorse any lightheadedness or dizziness no tongue swelling no trouble swallowing no abdominal cramping.  She cannot recall any other causative source aside from her Nexplanon  Home Medications Prior to Admission medications   Medication Sig Start Date End Date Taking? Authorizing Provider  Aspirin-Acetaminophen-Caffeine (EXCEDRIN PO) Take by mouth.    [provider]  cetirizine (ZYRTEC ALLERGY) 10 MG tablet Take 1 tablet (10 mg total) by mouth at bedtime. 03/25/22 09/21/22  Theadora Rama Scales, PA-C  EUCRISA 2 % OINT Apply 1 Application topically in the morning and at bedtime. 03/25/22   Theadora Rama Scales, PA-C  famotidine (PEPCID) 20 MG tablet Take 1 tablet (20 mg total) by mouth 2 (two) times daily. 03/31/22   Margaretann Loveless, PA-C  montelukast (SINGULAIR) 10 MG tablet Take 1 tablet  (10 mg total) by mouth at bedtime. 03/25/22 09/21/22  Theadora Rama Scales, PA-C  Multiple Vitamin (MULTIVITAMIN WITH MINERALS) TABS tablet Take 1 tablet by mouth daily.    [provider]  olopatadine (PATANOL) 0.1 % ophthalmic solution Place 1 drop into both eyes 2 (two) times daily. 03/31/22   Margaretann Loveless, PA-C  predniSONE (DELTASONE) 20 MG tablet Take 1 tablet (20 mg total) by mouth daily with breakfast for 14 days. 03/31/22 04/14/22  Margaretann Loveless, PA-C  triamcinolone (KENALOG) 0.025 % cream Apply 1 Application topically 2 (two) times daily. Apply to affected area(s) on face twice daily, discontinue once resolved. 03/25/22   Theadora Rama Scales, PA-C  triamcinolone cream (KENALOG) 0.1 % Apply 1 Application topically 2 (two) times daily. Apply to affected area(s) twice daily , do not apply to face. 03/26/22 04/25/22  Theadora Rama Scales, PA-C      Allergies    Patient has no known allergies.    Review of Systems   Review of Systems  All other systems reviewed and are negative.   Physical Exam Updated Vital Signs BP 117/83   Pulse 95   Temp 98.2 F (36.8 C)   Resp (!) 24   Ht 5\' 3"  (1.6 m)   Wt 86.2 kg   LMP 03/22/2022   SpO2 95%   BMI 33.66 kg/m  Physical Exam Vitals and nursing note reviewed.  Constitutional:      General: She is not in acute distress.  Appearance: She is well-developed.  HENT:     Head: Atraumatic.  Eyes:     Conjunctiva/sclera: Conjunctivae normal.  Pulmonary:     Effort: Pulmonary effort is normal.  Musculoskeletal:     Cervical back: Neck supple.  Skin:    Findings: Rash (Patient has diffuse patchy urticarial rash noted around oral region, face, chest, arms, legs) present.  Neurological:     Mental Status: She is alert.  Psychiatric:        Mood and Affect: Mood normal.     ED Results / Procedures / Treatments   Labs (all labs ordered are listed, but only abnormal results are displayed) Labs Reviewed - No  data to display  EKG None  Radiology No results found.  Procedures Procedures    Medications Ordered in ED Medications - No data to display  ED Course/ Medical Decision Making/ A&P                           Medical Decision Making  BP 117/83   Pulse 95   Temp 98.2 F (36.8 C)   Resp (!) 24   Ht 5\' 3"  (1.6 m)   Wt 86.2 kg   LMP 03/22/2022   SpO2 95%   BMI 33.66 kg/m   25:4 PM 26 year old female presenting with concerns of facial rash.  Patient states she had a birth control device (Nexplanon) placed in April of this year.  Since then she has had recurrent rash that appears on her body and her face.  She has been seen and evaluated for condition several times most recent was a week ago for which she was prescribed prednisone, steroid cream, Benadryl.  She has been taking the medication with some relief but her symptoms keep returning.  She did tried to reach out to her OB/GYN to have it removed but her next appointment is not until 2 weeks from now.  Sometimes at night she did notice some difficulty breathing but not currently.  Out of frustration of no improvement of her symptoms she decided to come to the ER tonight.  She does not endorse any lightheadedness or dizziness no tongue swelling no trouble swallowing no abdominal cramping.  She cannot recall any other causative source aside from her Nexplanon  Given her recurrent rash ongoing for several months I suspect this is likely an allergic reaction to her Nexplanon.  She does not exhibit symptoms concerning for anaphylaxis at this time.  She is resting comfortably speaking in complete sentences and does not have any wheezes on exam and no tongue swelling no abdominal cramping and she is not hypoxic.  Unfortunately no additional management will be helpful in this setting.  I will give patient referral to her OB/GYN in hopes that she can follow-up to have her device removed.  Patient voiced understanding.  I gave patient strict  return precaution in the event that she developed anaphylactic reaction.  She will continue taking medication recently prescribed.  I did review patient's EMR including video visit from her provider today who is currently manage her symptoms with appropriate medication.  She is on prednisone 20 mg daily until her appointment on 04/15/2022.        Final Clinical Impression(s) / ED Diagnoses Final diagnoses:  Allergic reaction, initial encounter    Rx / DC Orders ED Discharge Orders     None         Fayrene Helper, PA-C 03/31/22 2311  Jacalyn Lefevre, MD 04/01/22 [phone]

## 2022-04-03 ENCOUNTER — Encounter: Payer: Self-pay | Admitting: Lactation Services

## 2022-04-03 NOTE — Progress Notes (Unsigned)
Patient came into the office this morning complaining of an allergic reaction to the Nexplanon that was inserted in April 2023. She has been to the ED and has been treated with steroids and antihistamines. 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 She is noted to have very inflamed eyes and reports hives all over her body.   Spoke with Dr. Ernestina Patches and she reports that it is unlikely that it is due to the [place] but can be removed at patients request. Patient has an appointment on 11/01 for removal. There is no sooner appointment available at this time. She was informed to call the office every few days to see if cancellation.   Advised patient to reach out to PCP and request an allergy referral for evaluation. Patient voiced understanding.

## 2022-04-15 NOTE — Evaluation (Incomplete)
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  Rationale for Evaluation and Treatment: Rehabilitation  ONSET DATE: ***  SUBJECTIVE:                                                                                                                                                                                           SUBJECTIVE STATEMENT: *** Fluid intake: {Yes/No:304960894}   PAIN:  Are you having pain? {yes/no:20286} NPRS scale: ***/10 Pain location: {pelvic pain location:27098}  Pain type: {type:313116} Pain description: {PAIN DESCRIPTION:21022940}   Aggravating factors: *** Relieving factors: ***  PRECAUTIONS: {Therapy precautions:24002}  WEIGHT BEARING RESTRICTIONS: {Yes ***/No:24003}  FALLS:  Has patient fallen in last 6 months? {fallsyesno:27318}  LIVING ENVIRONMENT: Lives with: {[facility] lives with:25569::"lives with their family"} Lives in: {Lives in:25570} Stairs: {opstairs:27293} Has following equipment at home: {Assistive devices:23999}  OCCUPATION: ***  PLOF: {PLOF:24004}  PATIENT GOALS: ***  PERTINENT HISTORY:  *** Sexual abuse: {Yes/No:304960894}  BOWEL MOVEMENT: Pain with bowel movement: {yes/no:20286} Type of bowel movement:{PT BM type:27100} Fully empty  rectum: {Yes/No:304960894} Leakage: {Yes/No:304960894} Pads: {Yes/No:304960894} Fiber supplement: {Yes/No:304960894}  URINATION: Pain with urination: {yes/no:20286} Fully empty bladder: {Yes/No:304960894} Stream: {PT urination:27102} Urgency: {Yes/No:304960894} Frequency: *** Leakage: {PT leakage:27103} Pads: {Yes/No:304960894}  INTERCOURSE: Pain with intercourse: {pain with intercourse PA:27099} Ability to have vaginal penetration:  {Yes/No:304960894} Climax: *** Marinoff Scale: ***/3  PREGNANCY: Vaginal deliveries *** Tearing {Yes***/No:304960894} C-section deliveries *** Currently pregnant {Yes***/No:304960894}  PROLAPSE: {PT prolapse:27101}   OBJECTIVE:   DIAGNOSTIC FINDINGS:  ***  PATIENT SURVEYS:  {rehab surveys:24030}  PFIQ-7 ***  COGNITION: Overall cognitive status: {cognition:24006}     SENSATION: Light touch: {intact/deficits:24005} Proprioception: {intact/deficits:24005}  MUSCLE LENGTH: Hamstrings: Right *** deg; Left *** deg Thomas test: Right *** deg; Left *** deg  LUMBAR SPECIAL TESTS:  {lumbar special test:25242}  FUNCTIONAL TESTS:  {Functional tests:24029}  GAIT: Distance walked: *** Assistive device utilized: {Assistive devices:23999} Level of assistance: {Levels of assistance:24026} Comments: ***  POSTURE: {posture:25561}  PELVIC ALIGNMENT:  LUMBARAROM/PROM:  A/PROM A/PROM  eval  Flexion   Extension   Right lateral flexion   Left lateral flexion   Right rotation   Left rotation    (Blank rows = not tested)  LOWER EXTREMITY ROM:  {AROM/PROM:27142} ROM Right eval Left eval  Hip flexion    Hip extension    Hip abduction    Hip adduction    Hip internal rotation    Hip external rotation    Knee flexion    Knee extension    Ankle dorsiflexion    Ankle plantarflexion    Ankle inversion    Ankle eversion     (Blank rows = not tested)  LOWER EXTREMITY MMT:  MMT Right eval Left eval  Hip flexion    Hip  extension    Hip abduction    Hip adduction    Hip internal rotation    Hip external rotation    Knee flexion    Knee extension    Ankle dorsiflexion    Ankle plantarflexion    Ankle inversion    Ankle eversion     PALPATION:   General  ***                External Perineal Exam ***                             Internal Pelvic Floor ***  Patient confirms identification and approves PT to assess internal pelvic floor and treatment {yes/no:20286}  PELVIC MMT:   MMT eval  Vaginal   Internal Anal Sphincter   External Anal Sphincter   Puborectalis   Diastasis Recti   (Blank rows = not tested)        TONE: ***  PROLAPSE: ***  TODAY'S TREATMENT:     .ktr                                                                                                                           DATE: 04/17/22  EVAL  Manual: Soft tissue mobilization: Scar tissue mobilization: Myofascial release: Spinal mobilization: Internal pelvic floor techniques: Dry needling: Neuromuscular re-education: Core retraining:  Core facilitation: Form correction: Pelvic floor contraction training: Down training: Exercises: Stretches/mobility: Strengthening: Therapeutic activities: Functional strengthening activities: Self-care:    PATIENT EDUCATION:  Education details: *** Person educated: Patient Education method: Programmer, multimedia, Facilities manager, Actor cues, Verbal cues, and Handouts Education comprehension: verbalized understanding  HOME EXERCISE PROGRAM: ***  ASSESSMENT:  CLINICAL IMPRESSION: Patient is a *** y.o. *** who was seen today for physical therapy evaluation and treatment for ***.   OBJECTIVE IMPAIRMENTS: {opptimpairments:25111}.   ACTIVITY LIMITATIONS: {activitylimitations:27494}  PARTICIPATION LIMITATIONS: {participationrestrictions:25113}  PERSONAL FACTORS: {Personal factors:25162} are also affecting patient's functional outcome.   REHAB POTENTIAL:  {rehabpotential:25112}  CLINICAL DECISION MAKING: Stable/uncomplicated  EVALUATION COMPLEXITY: Low   GOALS: Goals reviewed with patient? Yes  SHORT TERM GOALS: Target date: {follow up:25551}  ***  Baseline: Goal status: {GOALSTATUS:25110}  2.  *** Baseline:  Goal status: {GOALSTATUS:25110}  3.  *** Baseline:  Goal status: {GOALSTATUS:25110}  4.  *** Baseline:  Goal status: {GOALSTATUS:25110}  5.  *** Baseline:  Goal status: {GOALSTATUS:25110}  6.  *** Baseline:  Goal status: {GOALSTATUS:25110}  LONG TERM GOALS: Target date: {follow up:25551}   *** Baseline:  Goal status: {GOALSTATUS:25110}  2.  *** Baseline:  Goal status: {GOALSTATUS:25110}  3.  *** Baseline:  Goal status: {GOALSTATUS:25110}  4.  *** Baseline:  Goal status: {GOALSTATUS:25110}  5.  *** Baseline:  Goal status: {GOALSTATUS:25110}  6.  *** Baseline:  Goal status: {GOALSTATUS:25110}  PLAN:  PT FREQUENCY: {rehab frequency:25116}  PT DURATION: {rehab duration:25117}  PLANNED INTERVENTIONS: Therapeutic exercises, Therapeutic activity, Neuromuscular re-education, Balance training, Gait training, Patient/Family education, Self Care, Joint mobilization, and Dry Needling  PLAN FOR NEXT SESSION: ***  Julio Alm, PT, DPT10/28/237:51 PM

## 2022-04-17 ENCOUNTER — Ambulatory Visit: Payer: Medicaid Other

## 2022-04-19 ENCOUNTER — Ambulatory Visit: Payer: Medicaid Other | Admitting: Obstetrics and Gynecology

## 2022-07-30 IMAGING — US US OB < 14 WEEKS - US OB TV
1 series · 15 of 28 positions shown · non-contrast
Comparison: None.

CLINICAL DATA: Pregnant, vaginal bleeding.



[Series 1: us ob < 14 weeks - us ob tv · 15 of 48 slices shown]
[im 1/48]
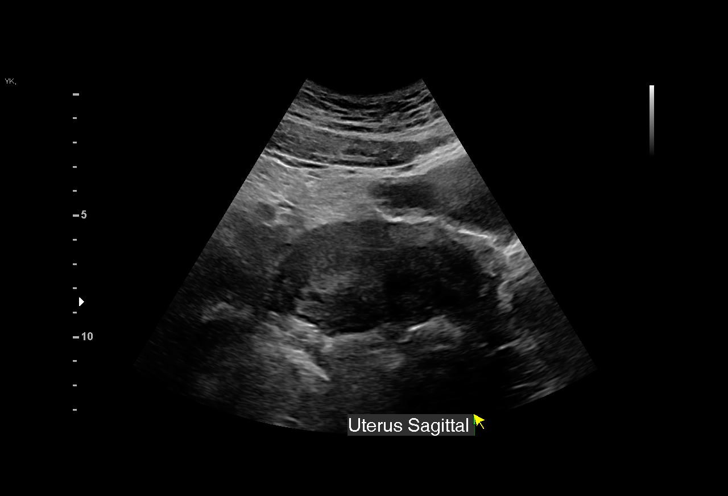
[im 4/48]
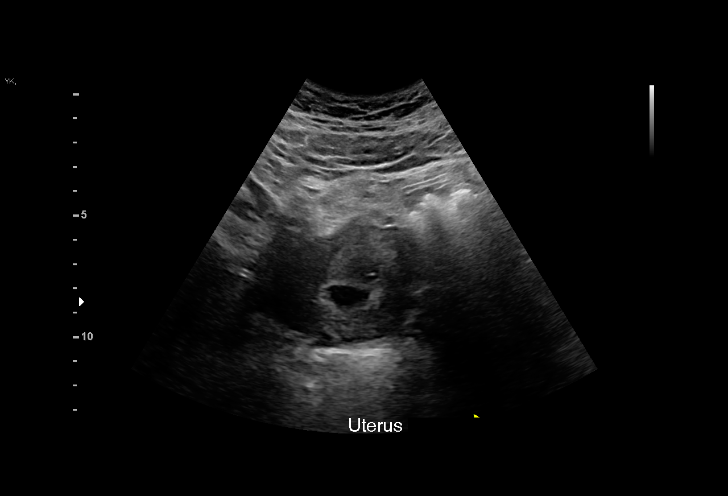
[im 7/48]
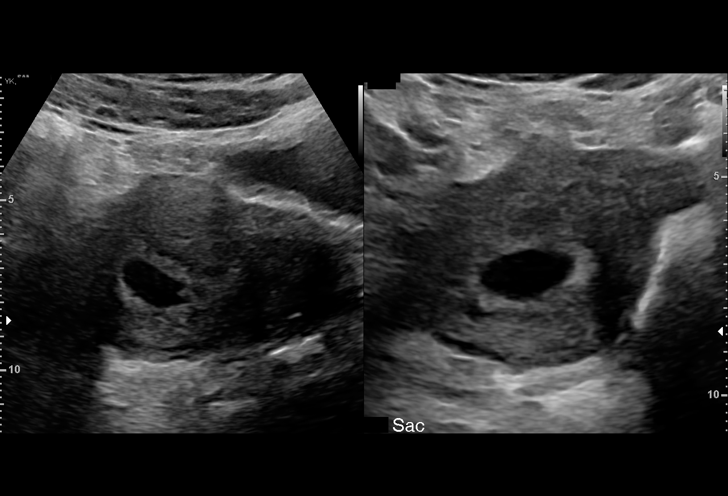
[im 11/48]
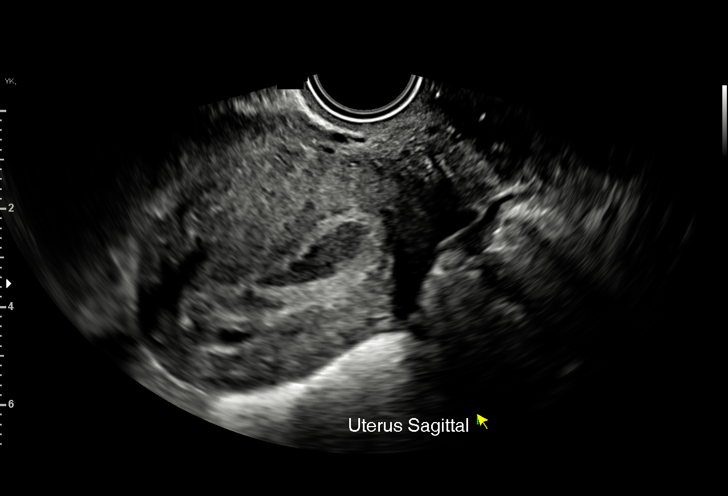
[im 14/48]
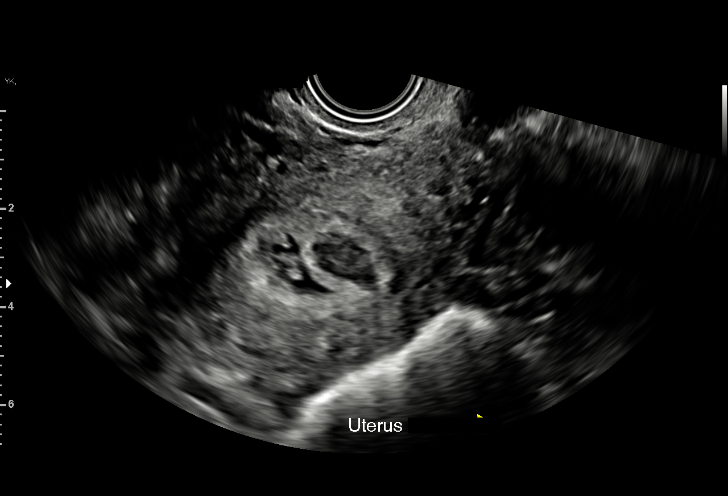
[im 18/48]
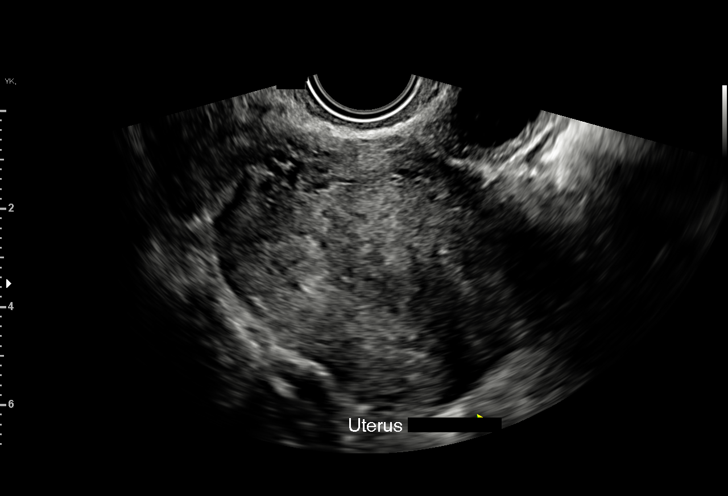
[im 21/48]
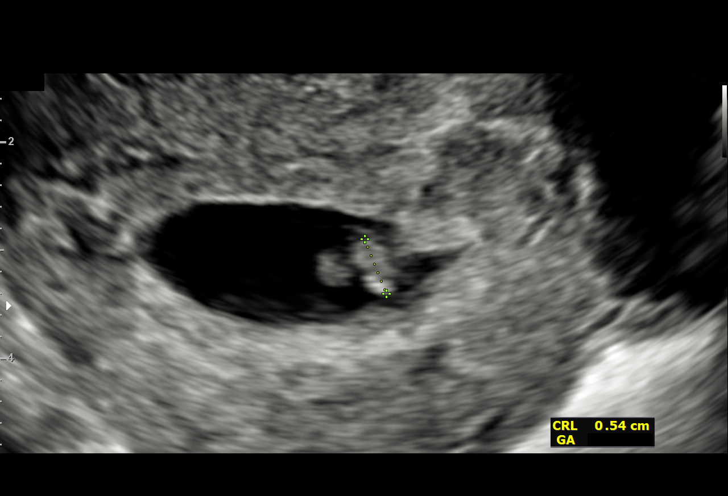
[im 25/48]
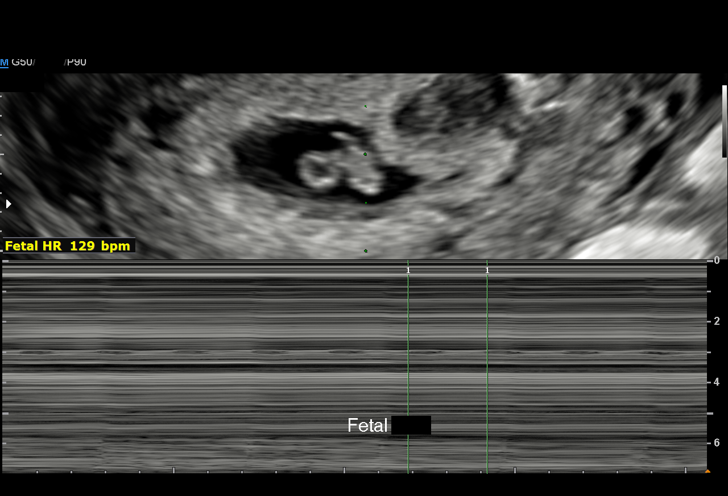
[im 27/48]
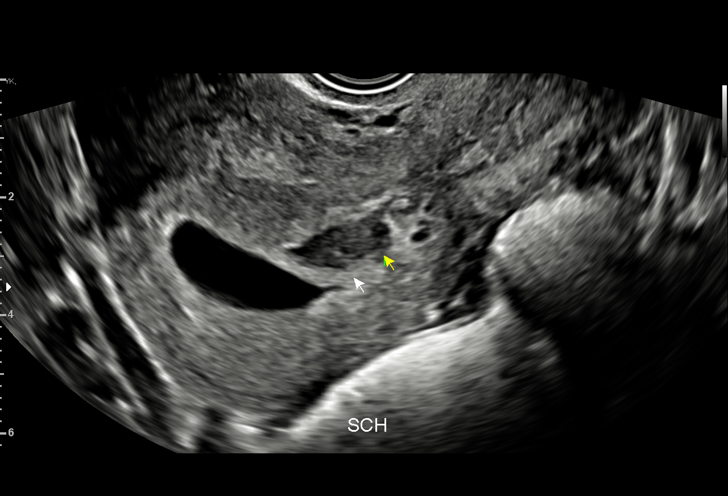
[im 30/48]
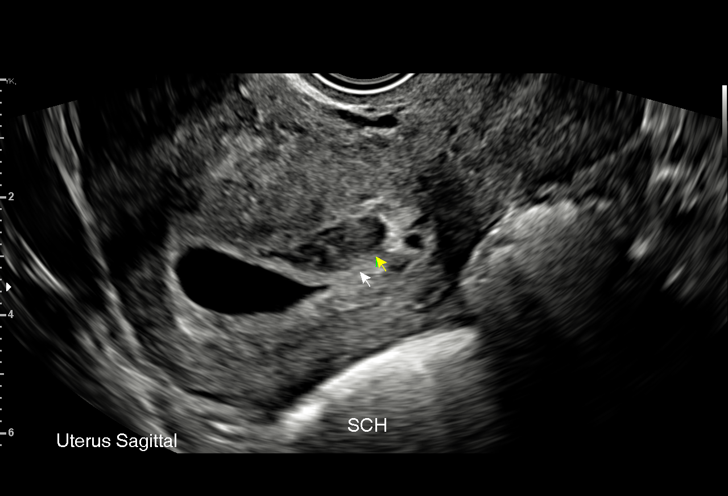
[im 34/48]
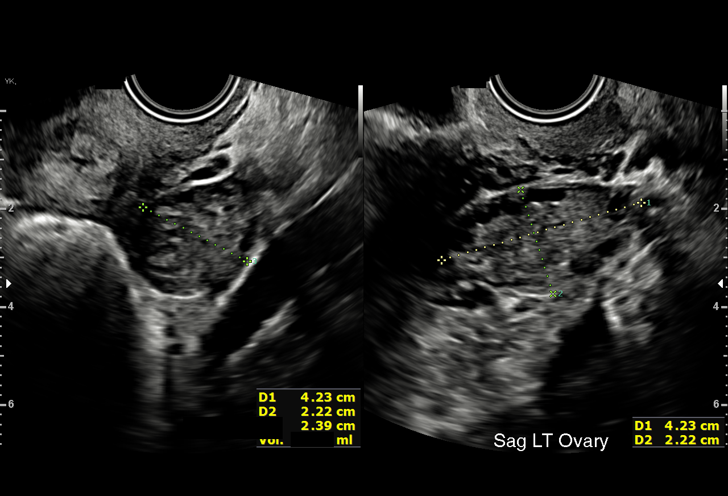
[im 37/48]
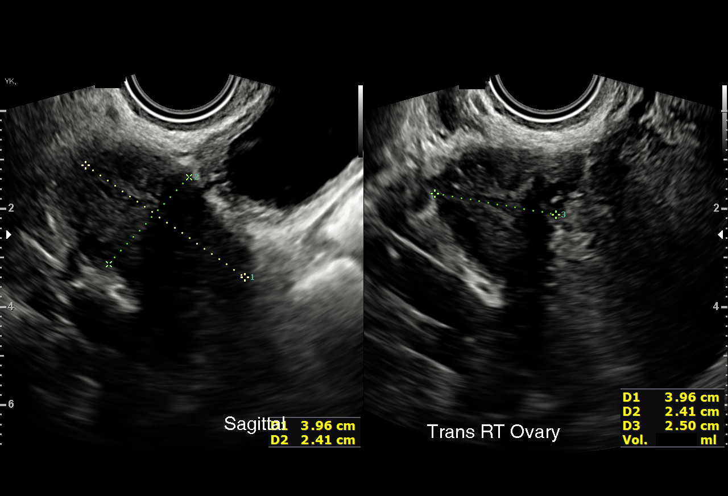
[im 41/48]
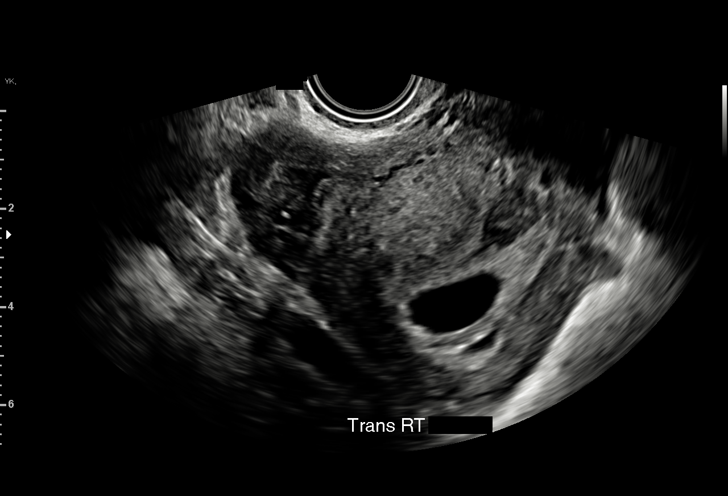
[im 44/48]
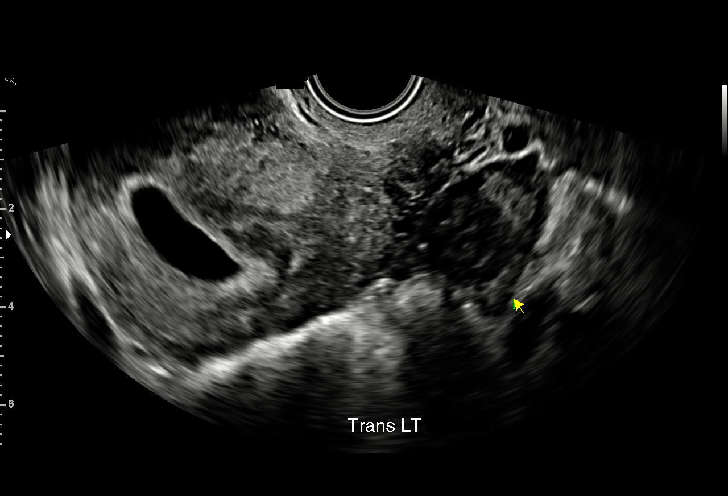
[im 48/48]
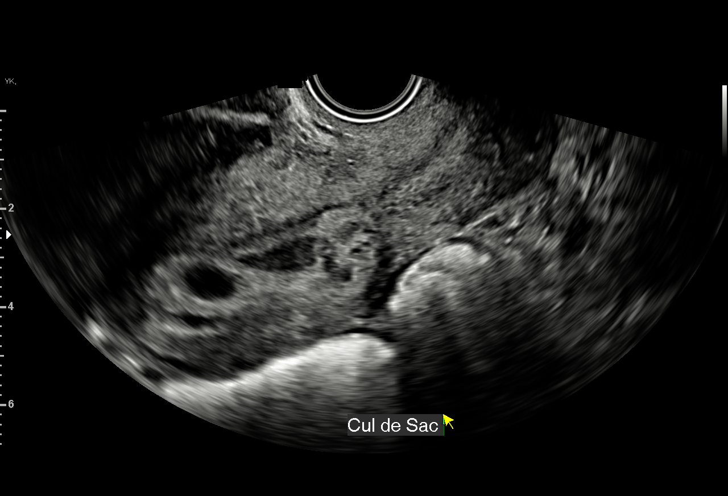

[15 of 28 positions shown; findings below may reference images not displayed]

FINDINGS: Intrauterine gestational sac: Single

Yolk sac:  Visualized.

Embryo:  Visualized.

Cardiac Activity: Visualized.

Heart Rate: 130 bpm

MSD:   mm    w     d

CRL:   5.9 mm   6 w 2 d                  US EDC: May 26, 2021

Subchorionic hemorrhage: Small subchorionic hemorrhage is
identified.

Maternal uterus/adnexae: The bilateral ovaries are normal. No free
fluid is identified.
IMPRESSION: Normal single intrauterine pregnancy measuring to 6 weeks and 2 days
by crown-rump length. Small subchorionic hemorrhage is noted.

## 2023-02-24 IMAGING — US US MFM OB FOLLOW-UP
1 series · 13 of 26 positions shown · non-contrast
Comparison: none

[Series 1: us mfm ob follow-up · 13 of 26 slices shown]
[im 2/26]
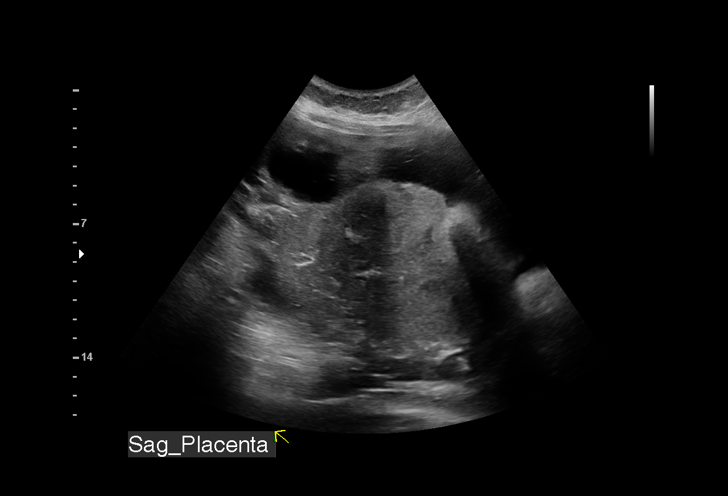
[im 4/26]
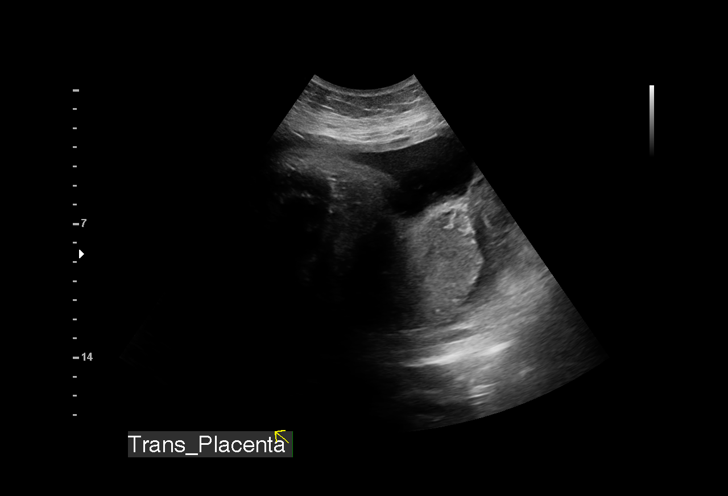
[im 6/26]
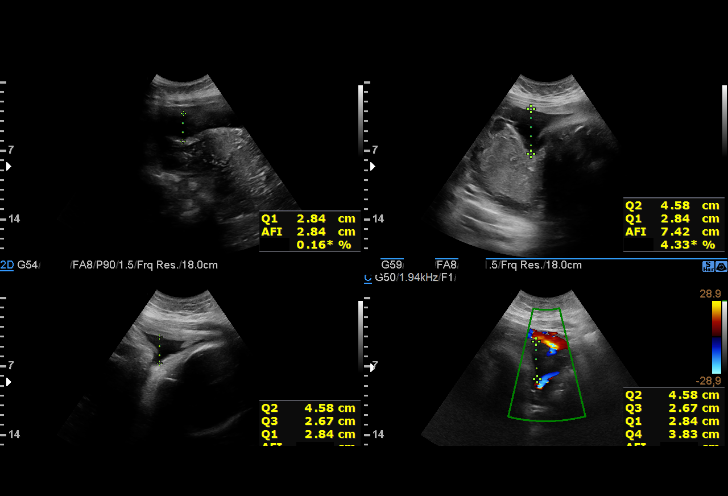
[im 8/26]
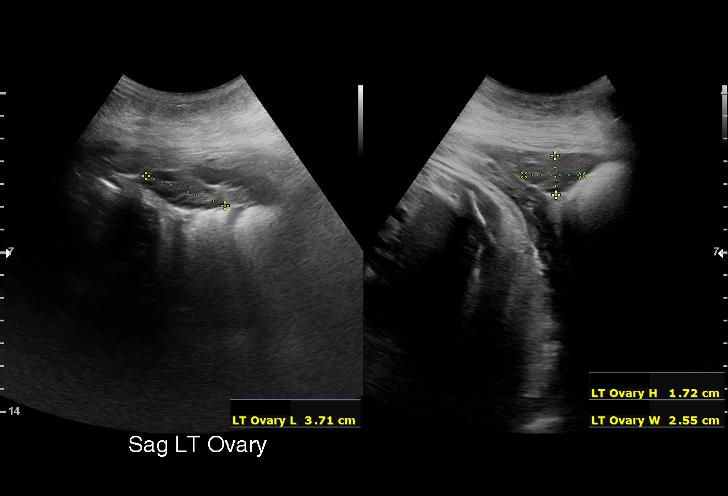
[im 10/26]
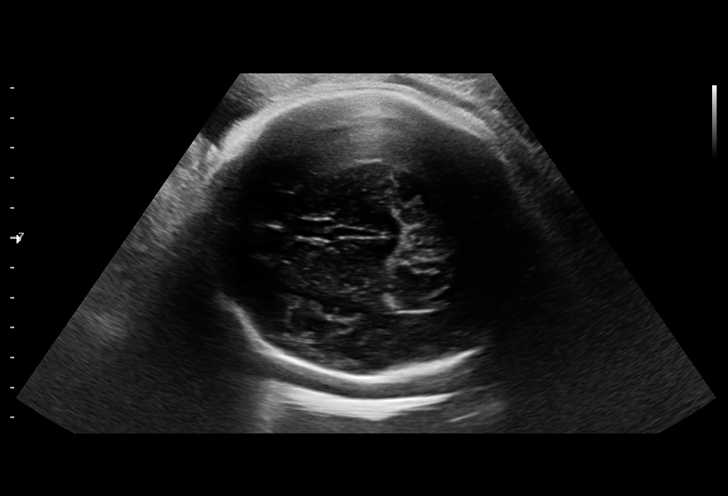
[im 12/26]
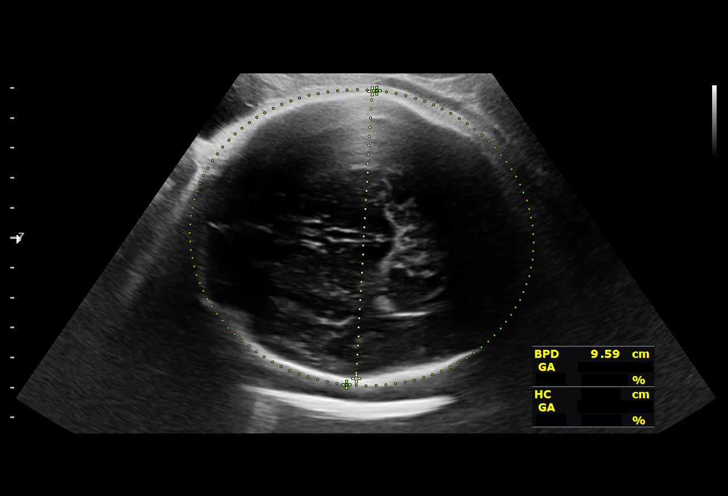
[im 14/26]
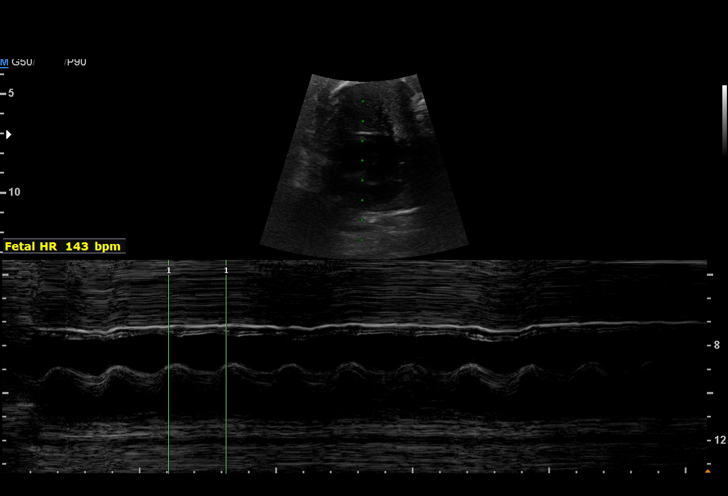
[im 16/26]
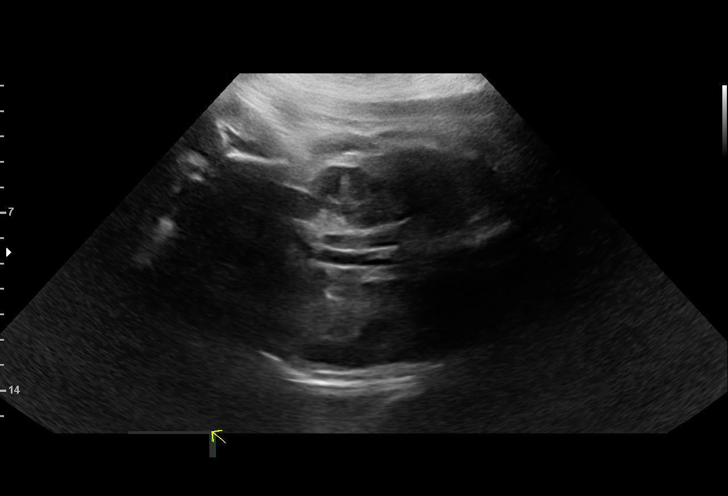
[im 18/26]
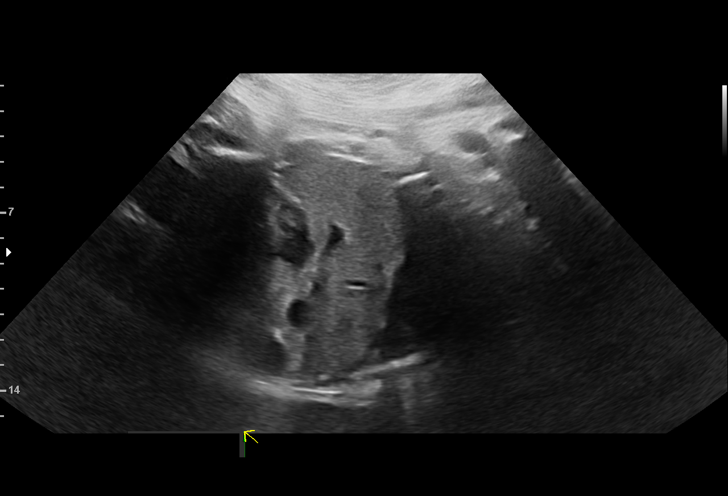
[im 20/26]
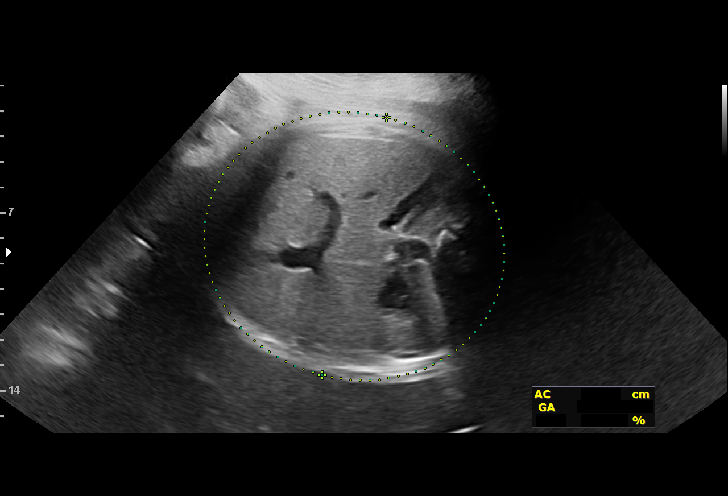
[im 22/26]
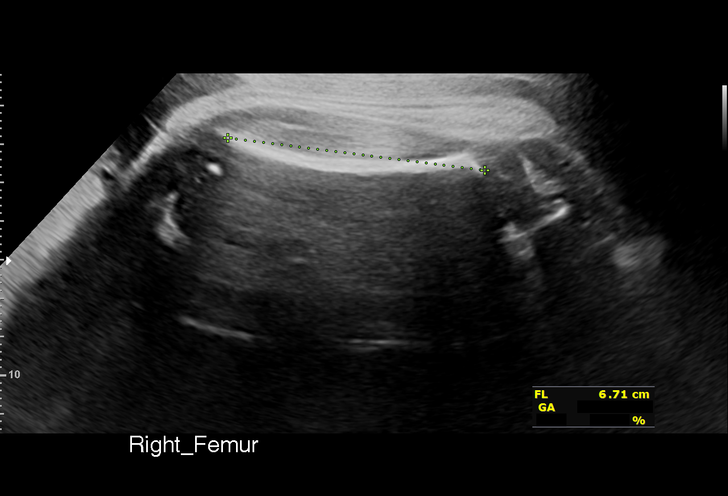
[im 24/26]
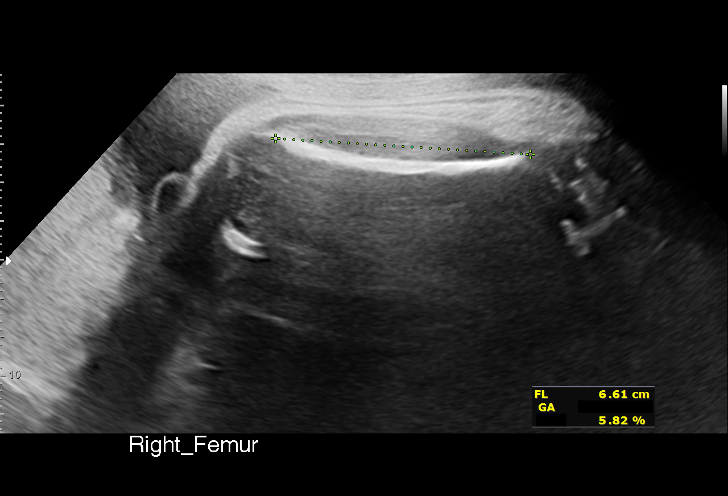
[im 26/26]
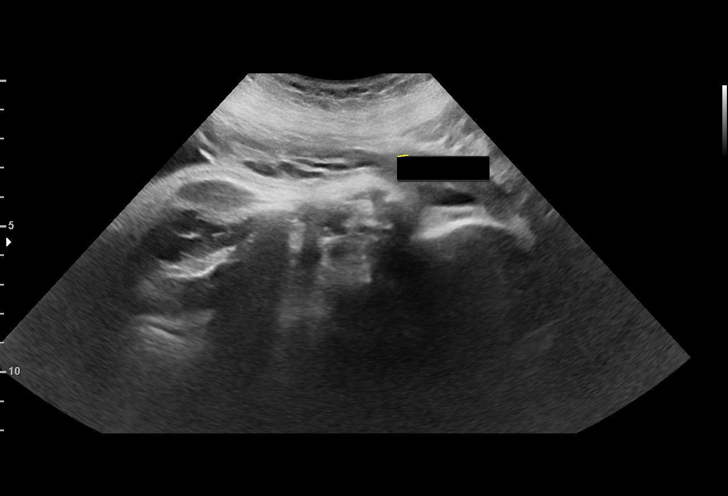

[13 of 26 positions shown; findings below may reference images not displayed]

BERBER

Indications

 Gestational diabetes in pregnancy, diet
 controlled
 36 weeks gestation of pregnancy
 Obesity complicating pregnancy, third
 trimester (BMi 30)
 Encounter for antenatal screening for
 malformations
 Size-Date Discrepancy
Fetal Evaluation

 Num Of Fetuses:         1
 Fetal Heart Rate(bpm):  143
 Cardiac Activity:       Observed
 Presentation:           Cephalic
 Placenta:               Posterior
 P. Cord Insertion:      Visualized

 Amniotic Fluid
 AFI FV:      Within normal limits

 AFI Sum(cm)     %Tile       Largest Pocket(cm)
 13.92           51

 RUQ(cm)       RLQ(cm)       LUQ(cm)        LLQ(cm)

Biometry

 BPD:      96.1  mm     G. Age:  39w 2d       > 99  %    CI:        83.21   %    70 - 86
                                                         FL/HC:      20.0   %    20.1 -
 HC:      332.2  mm     G. Age:  37w 6d         64  %    HC/AC:      0.93        0.93 -
 AC:      356.7  mm     G. Age:  39w 4d       > 99  %    FL/BPD:     69.2   %    71 - 87
 FL:       66.5  mm     G. Age:  34w 2d          8  %    FL/AC:      18.6   %    20 - 24

 LV:          8  mm

 Est. FW:    8412  gm      7 lb 9 oz     95  %
OB History

 Gravidity:    1
 Living:       0
Gestational Age

 LMP:           37w 3d        Date:  08/10/20                 EDD:   05/17/21
 U/S Today:     37w 5d                                        EDD:   05/15/21
 Best:          36w 1d     Det. By:  Early Ultrasound         EDD:   05/26/21
                                     (10/02/20)
Anatomy

 Cranium:               Appears normal         Aortic Arch:            Previously seen
 Cavum:                 Appears normal         Ductal Arch:            Not well visualized
 Ventricles:            Appears normal         Diaphragm:              Appears normal
 Choroid Plexus:        Previously seen        Stomach:                Appears normal, left
                                                                       sided
 Cerebellum:            Previously seen        Abdomen:                Appears normal
 Posterior Fossa:       Previously seen        Abdominal Wall:         Previously seen
 Nuchal Fold:           Not applicable (>20    Cord Vessels:           Previously seen
                        wks GA)
 Face:                  Orbits and profile     Kidneys:                Appear normal
                        previously seen
 Lips:                  Previously seen        Bladder:                Appears normal
 Thoracic:              Appears normal         Spine:                  Previously seen
 Heart:                 Previously seen        Upper Extremities:      Previously seen
 RVOT:                  Previously seen        Lower Extremities:      Previously seen
 LVOT:                  Previously seen
Cervix Uterus Adnexa

 Cervix
 Not visualized (advanced GA >36wks)

 Uterus
 No abnormality visualized.

 Right Ovary
 Within normal limits.

 Left Ovary
 Within normal limits.

 Adnexa
 No adnexal mass visualized.
Impression

 Fetal growth is appropriate for gestational age.  The
 estimated fetal weight is the 95th percentile.  Amniotic fluid is
 normal good fetal activity seen.
 xxxxxxxxxxxxxxxxxxxxxxxxxxxxxxxxxxx
 Consultation (see [REDACTED] )

 I had the [facility] of [facility]  She is here for follow-up of fetal
 growth assessment.
 Patient has gestational diabetes that is reportedly well
 controlled on diet.  She reports occasional postprandial levels
 are slightly higher.
 Patient had prenatal visit yesterday and her blood pressure
 was 138/88 mmHg.  Yesterday, she had headache, visual
 disturbances ("floaters") and epigastric pain.  All the
 symptoms have resolved today.  Blood pressures today at
 her office were 138/94 and 134/92 mmHg.
 I counseled the patient on increased blood pressure readings
 that need to be followed closely to make a diagnosis of
 gestational hypertension.  Patient has blood pressure cuff at
 home.  I discussed blood pressure parameters that are
 consistent with hypertension, and recommended that she
 contact your office or come to maternity admissions unit if her
 systolic blood pressures are consistently above 140 mmHg
 and/or diastolic blood pressures above 90 mmHg.
 I explained the possibility of diagnosis of gestational
 hypertension/preeclampsia which are associated with
 increased risk of maternal complications.
 Timing of delivery: If gestational hypertension is confirmed,
 we recommend delivery at 37 weeks gestation.

 Patient will be checking her blood pressures at least twice
 daily and will call your office or report to the [facility] if
 hypertension is suspected.
Recommendations

 -Delivery at 37 weeks gestation if gestational
 hypertension/preeclampsia is diagnosed.
                 Yusuf, Kirill

## 2023-03-06 ENCOUNTER — Ambulatory Visit: Payer: Medicaid Other | Admitting: Family Medicine

## 2023-03-06 ENCOUNTER — Telehealth: Payer: Self-pay

## 2023-03-06 NOTE — Telephone Encounter (Signed)
Called patient to reschedule missed appointment patient stated she thought appointment was 9/18, I offered to reschedule patient stated it was to long until November she would call our other communities and schedule an appointment.

## 2024-04-11 ENCOUNTER — Ambulatory Visit (INDEPENDENT_AMBULATORY_CARE_PROVIDER_SITE_OTHER): Admitting: Obstetrics and Gynecology

## 2024-04-11 ENCOUNTER — Other Ambulatory Visit (HOSPITAL_COMMUNITY)
Admission: RE | Admit: 2024-04-11 | Discharge: 2024-04-11 | Disposition: A | Discharge status: Home / Self Care | Source: Ambulatory Visit | Attending: Obstetrics and Gynecology | Admitting: Obstetrics and Gynecology

## 2024-04-11 ENCOUNTER — Other Ambulatory Visit: Payer: Self-pay

## 2024-04-11 VITALS — BP 102/67 | HR 83 | Wt 181.0 lb

## 2024-04-11 DIAGNOSIS — Z124 Encounter for screening for malignant neoplasm of cervix: Secondary | ICD-10-CM | POA: Insufficient documentation

## 2024-04-11 DIAGNOSIS — R102 Pelvic and perineal pain unspecified side: Secondary | ICD-10-CM | POA: Diagnosis not present

## 2024-04-11 DIAGNOSIS — Z113 Encounter for screening for infections with a predominantly sexual mode of transmission: Secondary | ICD-10-CM | POA: Diagnosis not present

## 2024-04-11 DIAGNOSIS — N941 Unspecified dyspareunia: Secondary | ICD-10-CM

## 2024-04-11 DIAGNOSIS — Z01419 Encounter for gynecological examination (general) (routine) without abnormal findings: Secondary | ICD-10-CM | POA: Diagnosis not present

## 2024-04-11 DIAGNOSIS — N939 Abnormal uterine and vaginal bleeding, unspecified: Secondary | ICD-10-CM | POA: Diagnosis not present

## 2024-04-11 DIAGNOSIS — N946 Dysmenorrhea, unspecified: Secondary | ICD-10-CM | POA: Diagnosis not present

## 2024-04-11 DIAGNOSIS — G8929 Other chronic pain: Secondary | ICD-10-CM

## 2024-04-11 MED ORDER — BACLOFEN 10 MG PO TABS: 10.0000 mg | ORAL_TABLET | Freq: Two times a day (BID) | ORAL | 0 refills | Status: DC | PRN

## 2024-04-11 NOTE — Progress Notes (Signed)
 ANNUAL EXAM Patient name: Cheryl Whitney MRN [id number]  Date of birth: [birth year] Chief Complaint:   Gynecologic Exam  History of Present Illness:   Cheryl Whitney is a 28 y.o. G1P0101 being seen today for a routine annual exam.  Current complaints: heavy menstrual bleeding; bleeding heavy every hour for the first day and ten moderate, changing pad every 1-2 hours and then light days changing pads every 3-4 hours then stop for 1.5 weeks and then same 4 day cycle will repeat again. 10/2 is LMP  Menstrual concerns? Yes  heavy menstrual bleeding with worsening cramping Breast or nipple changes? No  Contraception use? Yes condoms Sexually active? Yes pain with intercourse which has worsened since August  Discussed the use of AI scribe software for clinical note transcription with the patient, who gave verbal consent to proceed.  History of Present Illness Cheryl Whitney is a 28 year old female who presents with concerns of abnormal uterine bleeding.  She has been experiencing abnormal uterine bleeding since August, characterized by frequent bleeding episodes. Each episode typically lasts for four days, with the first two days being the heaviest, requiring a pad change every one to two hours, followed by lighter bleeding for two more days. There is then a period of no bleeding for about a week and a half. Her last period began at the end of September and continued into early October, with no subsequent bleeding since October 5th.  She has a history of using Nexplanon , which was removed in October of the previous year, and has not been replaced with any other hormonal contraceptive. She is currently sexually active and uses condoms for contraception. Prior to August, her menstrual cycles were regular, occurring monthly and lasting about a week since her teenage years.  No recent medication changes, significant weight changes, nipple discharge, bowel habit changes,  or abnormal vaginal discharge. She also reports no pain currently, but describes a sensation of something 'moving' inside her pelvis, which she has felt since her 2023 pelvic exam. She experiences pain during intercourse, which sometimes persists afterward, and rates this pain as a seven to ten on a scale of ten.  Her past medical history includes a pelvic ultrasound two years ago, which showed a normal uterus and a small cyst. No burning with urination, blood in urine or stool, and no concerns for STDs. She is open to testing for STDs and other blood work, including tests for anemia.    Patient's last menstrual period was 03/20/2024 (exact date).   The pregnancy intention screening data noted above was reviewed. Potential methods of contraception were discussed. The patient elected to proceed with No data recorded.   Last pap No results found for: DIAGPAP, HPVHIGH, ADEQPAP Last mammogram: n/a.  Last colonoscopy: n/a.      06/16/2021    9:22 AM 05/18/2021    4:09 PM 04/06/2021    2:40 PM  Depression screen PHQ 2/9  Decreased Interest 0 0 2  Down, Depressed, Hopeless 0 1 0  PHQ - 2 Score 0 1 2  Altered sleeping 0 0 1  Tired, decreased energy 1 2 2   Change in appetite 0 0 2  Feeling bad or failure about yourself  0 0 0  Trouble concentrating 0 0 0  Moving slowly or fidgety/restless 0 0 1  Suicidal thoughts 0 0 0  PHQ-9 Score 1 3 8         06/16/2021    9:22 AM 05/18/2021  4:09 PM 04/06/2021    2:40 PM  GAD 7 : Generalized Anxiety Score  Nervous, Anxious, on Edge 1 1 0  Control/stop worrying 0 1 2  Worry too much - different things 1 1 2   Trouble relaxing 1 1 2   Restless 0 1 0  Easily annoyed or irritable 1 1 2   Afraid - awful might happen 1 0 2  Total GAD 7 Score 5 6 10      Review of Systems:   Pertinent items are noted in HPI Denies any headaches, blurred vision, fatigue, shortness of breath, chest pain, abdominal pain, abnormal vaginal  discharge/itching/odor/irritation, problems with periods, bowel movements, urination, or intercourse unless otherwise stated above. Pertinent History Reviewed:  Reviewed past medical,surgical, social and family history.  Reviewed problem list, medications and allergies. Physical Assessment:   Vitals:   04/11/24 1003  BP: 102/67  Pulse: 83  Weight: 181 lb (82.1 kg)  Body mass index is 32.06 kg/m.        Physical Examination:   General appearance - well appearing, and in no distress  Mental status - alert, oriented to person, place, and time  Psych:  She has a normal mood and affect  Skin - warm and dry, normal color, no suspicious lesions noted  Chest - effort normal, all lung fields clear to auscultation bilaterally  Heart - normal rate and regular rhythm  Abdomen - soft, nontender, nondistended, no masses or organomegaly  Pelvic -  VULVA: normal appearing vulva with no masses, tenderness or lesions   VAGINA: normal appearing vagina with normal color and discharge, no lesions   CERVIX: normal appearing cervix without discharge or lesions, no CMT  Thin prep pap is done with HR HPV cotesting  UTERUS: uterus is felt to be normal size, mild CMT  ADNEXA: No adnexal masses or tenderness noted.  Normal appearing vulva Normal vulvar sensation bilaterally Tender superficial pelvic floor muscles Nontender ischial tuberosities bilaterally  Allodynia at introitus: Yes: 9 o'clock  Right levator ani 8/10 Right ischiococcygeous 9/10 Right obturator internus 10/10 Left levator ani 10/10 Left ischioccocygeous 10/10 Left obturator internus 10/10 Anterior vaginal wall tender Uterine tender  Extremities:  No swelling or varicosities noted  Chaperone present for exam  No results found for this or any previous visit (from the past 24 hours).    Assessment & Plan:   Assessment & Plan Pelvic floor dysfunction with pain and dyspareunia Chronic pelvic floor dysfunction with pain and  dyspareunia. Pelvic floor tenderness suggests possible spasm. Stress may exacerbate condition. - Refer to pelvic floor physical therapy for pelvic myalgia  - Consider muscle relaxants for pain management outside intercourse, caution for drowsiness. - Recommend acetaminophen  or ibuprofen  on physical therapy days for soreness.  Tenderness of uterus, under evaluation Uterine tenderness noted. Differential includes endometritis or other inflammatory responses. No abnormal discharge noted. - Order ultrasound to evaluate ovaries and uterus. - Order blood work to rule out underlying conditions.  Screening for cervical cancer Routine cervical cancer screening due. She consented to testing. - Pap smear completed today  - Conduct STD testing using same swab as Pap smear.  General Health Maintenance She lacks a regular primary care provider. General health maintenance and routine screenings recommended. - Recommend establishing care with a primary care provider.    Orders Placed This Encounter  Procedures   US  PELVIC COMPLETE WITH TRANSVAGINAL   Hemoglobin A1c   TSH Rfx on Abnormal to Free T4   CBC   RPR+HBsAg+HCVAb+...   Ambulatory  referral to Physical Therapy    Meds:  Meds ordered this encounter  Medications   baclofen (LIORESAL) 10 MG tablet    Sig: Take 1 tablet (10 mg total) by mouth 2 (two) times daily as needed for muscle spasms.    Dispense:  30 each    Refill:  0    Follow-up: No follow-ups on file.  Carter Quarry, MD 04/11/2024 10:16 AM

## 2024-04-12 LAB — RPR+HBSAG+HCVAB+...
HIV Screen 4th Generation wRfx: NONREACTIVE
Hep C Virus Ab: NONREACTIVE
Hepatitis B Surface Ag: NEGATIVE
RPR Ser Ql: NONREACTIVE

## 2024-04-12 LAB — CBC
Hematocrit: 41.4 % (ref 34.0–46.6)
Hemoglobin: 14 g/dL (ref 11.1–15.9)
MCH: 31.3 pg (ref 26.6–33.0)
MCHC: 33.8 g/dL (ref 31.5–35.7)
MCV: 92 fL (ref 79–97)
Platelets: 152 x10E3/uL (ref 150–450)
RBC: 4.48 x10E6/uL (ref 3.77–5.28)
RDW: 12.2 % (ref 11.7–15.4)
WBC: 6.5 x10E3/uL (ref 3.4–10.8)

## 2024-04-12 LAB — HEMOGLOBIN A1C
Est. average glucose Bld gHb Est-mCnc: 103 mg/dL
Hgb A1c MFr Bld: 5.2 % (ref 4.8–5.6)

## 2024-04-12 LAB — TSH RFX ON ABNORMAL TO FREE T4: TSH: 1.19 u[IU]/mL (ref 0.450–4.500)

## 2024-04-14 ENCOUNTER — Ambulatory Visit: Admitting: Physical Therapy

## 2024-04-14 LAB — CYTOLOGY - PAP
Chlamydia: NEGATIVE
Comment: NEGATIVE
Comment: NEGATIVE
Comment: NORMAL
Diagnosis: NEGATIVE
Neisseria Gonorrhea: NEGATIVE
Trichomonas: NEGATIVE

## 2024-04-14 NOTE — Therapy (Incomplete)
 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  Rationale for Evaluation and Treatment: Rehabilitation  ONSET DATE: ***  SUBJECTIVE:                                                                                                                                                                                           SUBJECTIVE STATEMENT: *** Fluid intake:   FUNCTIONAL LIMITATIONS: ***  PERTINENT HISTORY:  Medications for current condition: *** Surgeries: *** Other: *** Sexual abuse: {Yes/No:304960894}  DIAGNOSTIC FINDINGS:  Post-void residual: Voiding Cystourethrogram (VCUG):  Ultrasound: PAIN:  Are you having pain? {yes/no:20286} NPRS scale: ***/10 Pain location: {pelvic pain location:27098}  Pain type: {type:313116} Pain description: {PAIN DESCRIPTION:21022940}   Aggravating factors: *** Relieving factors: ***  PRECAUTIONS: {Therapy precautions:24002}  RED FLAGS: {PT Red Flags:29287}   WEIGHT BEARING RESTRICTIONS: {Yes ***/No:24003}  FALLS:  Has patient fallen in last 6 months? {fallsyesno:27318}  OCCUPATION: ***  ACTIVITY LEVEL : ***  PLOF: {PLOF:24004}  PATIENT GOALS: ***   BOWEL MOVEMENT: Pain with  bowel movement: {yes/no:20286} Type of bowel movement:{PT BM type:27100} Fully empty rectum: {No/Yes:304960894} Leakage: {Yes/No:304960894}                                                     Caused by: *** Pads: {Yes/No:304960894} Fiber supplement/laxative {YES/NO AS:20300}  URINATION: Pain with urination: {yes/no:20286} Fully empty bladder: {Yes/No:304960894}***                                Post-void dribble: {YES/NO AS:20300} Stream: {PT urination:27102} Urgency: {YES/NO AS:20300} Frequency:during the day ***  Nocturia: {Yes/No:304960894}***   Leakage: {PT leakage:27103} Pads/briefs: {Yes/No:304960894}  INTERCOURSE:  Ability to have vaginal penetration {YES/NO:21197} Pain with intercourse: {pain with intercourse PA:27099} Dryness: {YES/NO AS:20300} Climax: *** Marinoff Scale: ***/3 Lubricant:  PREGNANCY: Vaginal deliveries *** Tearing {Yes***/No:304960894} Episiotomy {YES/NO AS:20300} C-section deliveries *** Currently pregnant {Yes***/No:304960894}  PROLAPSE: {PT prolapse:27101}   OBJECTIVE:  Note: Objective measures were completed at Evaluation unless otherwise noted.  DIAGNOSTIC FINDINGS:  ***  PATIENT SURVEYS:  {rehab surveys:24030}  PFIQ-7: ***  COGNITION: Overall cognitive status: {cognition:24006}     SENSATION: Light touch: {intact/deficits:24005}  LUMBAR SPECIAL TESTS:  {lumbar special test:25242}  FUNCTIONAL TESTS:  {Functional tests:24029} Single leg stance:  Rt:  Lt: Sit-up test: Squat: Bed mobility:  GAIT: Assistive device utilized: {Assistive devices:23999} Comments: ***  POSTURE: {posture:25561}   LUMBARAROM/PROM:  A/PROM A/PROM  Eval (% available)  Flexion   Extension   Right lateral flexion   Left lateral flexion   Right rotation   Left rotation    (Blank rows = not tested)  LOWER EXTREMITY ROM:  {AROM/PROM:27142} ROM Right eval Left eval  Hip flexion     Hip extension    Hip abduction    Hip adduction    Hip internal rotation    Hip external rotation    Knee flexion    Knee extension    Ankle dorsiflexion    Ankle plantarflexion    Ankle inversion    Ankle eversion     (Blank rows = not tested)  LOWER EXTREMITY MMT:  MMT Right eval Left eval  Hip flexion    Hip extension    Hip abduction    Hip adduction    Hip internal rotation    Hip external rotation    Knee flexion    Knee extension    Ankle dorsiflexion    Ankle plantarflexion    Ankle inversion    Ankle eversion     (Blank rows = not tested) PALPATION:  General: ***  Pelvic Alignment: ***  Abdominal: ***  Diastasis: {Yes/No:304960894}*** Distortion: {YES/NO AS:20300}  Breathing: *** Scar tissue: {Yes/No:304960894}***                External Perineal Exam: ***                             Internal Pelvic Floor: ***  Patient confirms identification and approves PT to assess internal pelvic floor and treatment {yes/no:20286}  PELVIC MMT:   MMT eval  Vaginal   Internal Anal Sphincter   External Anal Sphincter   Puborectalis   Diastasis Recti   (Blank rows = not tested)        TONE: ***  PROLAPSE: ***  TODAY'S TREATMENT:                                                                                                                              DATE: ***  EVAL ***   PATIENT EDUCATION:  Education details: *** Person educated: {Person educated:25204} Education method: {Education Method:25205} Education comprehension: {Education Comprehension:25206}  HOME EXERCISE PROGRAM: ***  ASSESSMENT:  CLINICAL IMPRESSION: Patient is a *** y.o. *** who was seen today for physical therapy evaluation and treatment for ***.   OBJECTIVE IMPAIRMENTS: {opptimpairments:25111}.   ACTIVITY LIMITATIONS: {activitylimitations:27494}  PARTICIPATION LIMITATIONS: {participationrestrictions:25113}  PERSONAL FACTORS: {Personal factors:25162} are also  affecting patient's functional outcome.   REHAB POTENTIAL: {rehabpotential:25112}  CLINICAL DECISION MAKING: {clinical decision making:25114}  EVALUATION COMPLEXITY: {Evaluation complexity:25115}   GOALS: Goals reviewed with patient? {yes/no:20286}  SHORT TERM GOALS: Target date: ***  *** Baseline: Goal status: INITIAL  2.  *** Baseline:  Goal status: INITIAL  3.  *** Baseline:  Goal status: INITIAL  4.  *** Baseline:  Goal status: INITIAL  5.  *** Baseline:  Goal status: INITIAL  6.  *** Baseline:  Goal status: INITIAL  LONG TERM GOALS: Target date: ***  *** Baseline:  Goal status: INITIAL  2.  *** Baseline:  Goal status: INITIAL  3.  *** Baseline:  Goal status: INITIAL  4.  *** Baseline:  Goal status: INITIAL  5.  *** Baseline:  Goal status: INITIAL  6.  *** Baseline:  Goal status: INITIAL  PLAN:  PT FREQUENCY: {rehab frequency:25116}  PT DURATION: {rehab duration:25117}  PLANNED INTERVENTIONS: {rehab planned interventions:25118::97110-Therapeutic exercises,97530- Therapeutic [id number]- Neuromuscular re-education,97535- Self Rjmz,02859- Manual therapy,Patient/Family education}  PLAN FOR NEXT SESSION: ***   Terrie Grajales, PT 04/14/2024, 10:00 AM

## 2024-04-15 ENCOUNTER — Ambulatory Visit: Payer: Self-pay | Admitting: Obstetrics and Gynecology

## 2024-04-23 ENCOUNTER — Ambulatory Visit: Payer: Self-pay | Admitting: Clinical

## 2024-04-23 NOTE — BH Specialist Note (Signed)
 Integrated Behavioral Health via Telemedicine Visit  04/23/2024 Cheryl Whitney [id number]  Pt arrived to very brief video visit with unknown reason for scheduled visit; pt was neither referred to see [facility], nor did she request a visit.      04/11/2024   11:50 AM 06/16/2021    9:22 AM 05/18/2021    4:09 PM 04/06/2021    2:40 PM  Depression screen PHQ 2/9  Decreased Interest 1 0 0 2  Down, Depressed, Hopeless 0 0 1 0  PHQ - 2 Score 1 0 1 2  Altered sleeping 0 0 0 1  Tired, decreased energy 3 1 2 2   Change in appetite 1 0 0 2  Feeling bad or failure about yourself  0 0 0 0  Trouble concentrating 2 0 0 0  Moving slowly or fidgety/restless 0 0 0 1  Suicidal thoughts 0 0 0 0  PHQ-9 Score 7 1 3 8       04/11/2024   11:51 AM 06/16/2021    9:22 AM 05/18/2021    4:09 PM 04/06/2021    2:40 PM  GAD 7 : Generalized Anxiety Score  Nervous, Anxious, on Edge 0 1 1 0  Control/stop worrying 0 0 1 2  Worry too much - different things 0 1 1 2   Trouble relaxing 0 1 1 2   Restless 1 0 1 0  Easily annoyed or irritable 1 1 1 2   Afraid - awful might happen 0 1 0 2  Total GAD 7 Score 2 5 6  10

## 2024-06-04 ENCOUNTER — Ambulatory Visit: Payer: Self-pay

## 2024-06-04 VITALS — BP 110/72 | HR 84 | Wt 182.4 lb

## 2024-06-04 DIAGNOSIS — Z3201 Encounter for pregnancy test, result positive: Secondary | ICD-10-CM

## 2024-06-04 DIAGNOSIS — Z3200 Encounter for pregnancy test, result unknown: Secondary | ICD-10-CM

## 2024-06-04 LAB — POCT URINE PREGNANCY: Preg Test, Ur: POSITIVE — AB

## 2024-06-04 MED ORDER — VITAFOL GUMMIES 3.33-0.333-34.8 MG PO CHEW: 3.0000 | CHEWABLE_TABLET | Freq: Every day | ORAL | 11 refills | Status: AC

## 2024-06-04 NOTE — Progress Notes (Signed)
..  Cheryl Whitney presents today for UPT. She has no unusual complaints. Pt reports hx of irregular cycles so LMP is an estimation. LMP: 04/28/24    OBJECTIVE: Appears well, in no apparent distress.  OB History     Gravida  2   Para  1   Term      Preterm  1   AB      Living  1      SAB      IAB      Ectopic      Multiple  0   Live Births  1          Home UPT Result:Positive In-Office UPT result:Positive I have reviewed the patient's medical, obstetrical, social, and family histories, and medications.   ASSESSMENT: Positive pregnancy test  PLAN Prenatal care to be completed at: [facility] Provided safe med list PNV sent to pharmacy

## 2024-06-30 ENCOUNTER — Other Ambulatory Visit (HOSPITAL_COMMUNITY)
Admission: RE | Admit: 2024-06-30 | Discharge: 2024-06-30 | Disposition: A | Source: Ambulatory Visit | Attending: Obstetrics and Gynecology | Admitting: Obstetrics and Gynecology

## 2024-06-30 ENCOUNTER — Other Ambulatory Visit (INDEPENDENT_AMBULATORY_CARE_PROVIDER_SITE_OTHER): Payer: Self-pay

## 2024-06-30 ENCOUNTER — Ambulatory Visit: Payer: Self-pay | Admitting: *Deleted

## 2024-06-30 VITALS — BP 110/83 | HR 70 | Wt 182.9 lb

## 2024-06-30 DIAGNOSIS — O3680X0 Pregnancy with inconclusive fetal viability, not applicable or unspecified: Secondary | ICD-10-CM

## 2024-06-30 DIAGNOSIS — O0990 Supervision of high risk pregnancy, unspecified, unspecified trimester: Secondary | ICD-10-CM | POA: Insufficient documentation

## 2024-06-30 DIAGNOSIS — O0991 Supervision of high risk pregnancy, unspecified, first trimester: Secondary | ICD-10-CM

## 2024-06-30 DIAGNOSIS — Z3A09 9 weeks gestation of pregnancy: Secondary | ICD-10-CM

## 2024-06-30 MED ORDER — BLOOD PRESSURE KIT DEVI: 1.0000 | 0 refills | Status: AC

## 2024-06-30 MED ORDER — ASPIRIN 81 MG PO TBEC: 81.0000 mg | DELAYED_RELEASE_TABLET | Freq: Every day | ORAL | 2 refills | Status: AC

## 2024-06-30 NOTE — Progress Notes (Signed)
 New OB Intake  I connected with Cheryl Whitney  on 06/30/2024 at  9:15 AM EST by In Person Visit and verified that I am speaking with the correct person using two identifiers. Nurse is located at [facility] and pt is located at [place].  I discussed the limitations, risks, security and privacy concerns of performing an evaluation and management service by telephone and the availability of in person appointments. I also discussed with the patient that there may be a patient responsible charge related to this service. The patient expressed understanding and agreed to proceed.  I explained I am completing New OB Intake today. We discussed EDD of 02/02/25 based on LMP of 04/28/24. Pt is [id number]. I reviewed her allergies, medications and Medical/Surgical/OB history.    Patient Active Problem List   Diagnosis Date Noted   Supervision of high risk pregnancy, antepartum 06/30/2024   Nexplanon  insertion 10/03/2021   History of gestational diabetes 04/06/2021   Sudden-onset sensorineural hearing loss 12/14/2020     Concerns addressed today  Delivery Plans Plans to deliver at [facility]. Discussed the nature of our practice with multiple providers including residents and students as well as female and female providers. Due to the size of the practice, the delivering provider may not be the same as those providing prenatal care.   Patient is interested in water birth.  MyChart/Babyscripts MyChart access verified. I explained pt will have some visits in office and some virtually. Babyscripts instructions given and order placed. Patient verifies receipt of registration text/e-mail. Account successfully created and app downloaded. If patient is a candidate for Optimized scheduling, add to sticky note.   Blood Pressure Cuff/Weight Scale Blood pressure cuff ordered for patient to pick-up from [employer]. Explained after first prenatal appt pt will check weekly and document in Babyscripts. Patient  does not have weight scale; patient may purchase if they desire to track weight weekly in Babyscripts.  Anatomy US  Explained first scheduled US  will be around 19 weeks. Anatomy US  scheduled for TBD at [facility].  Is patient a candidate for Babyscripts Optimization? No, due to Risk Factors   First visit review I reviewed new OB appt with patient. Explained pt will be seen by Cheryl Daring, NP at first visit. Discussed Cheryl Whitney genetic screening with patient. Requests [employer] and [employer].. Routine prenatal labs OB Urine and GC/CC collected at today's visit. Initial prenatal labs deferred to New OB appt or > [redacted] wks GA.   Last Pap Diagnosis  Date Value Ref Range Status  04/11/2024   Final   - Negative for intraepithelial lesion or malignancy (NILM)    Cheryl CHRISTELLA Ober, RN 06/30/2024  10:24 AM

## 2024-06-30 NOTE — Patient Instructions (Signed)
[facility] has a partnership with the [employer] to provide prenatal navigation for the most needed resources in our community. In order to see how we can help connect you to these resources we need consent to contact you. Please complete the very short consent using the link below:   English Link: [email or web address]  Spanish [email or web address]  

## 2024-07-01 LAB — CERVICOVAGINAL ANCILLARY ONLY
Chlamydia: NEGATIVE
Comment: NEGATIVE
Comment: NORMAL
Neisseria Gonorrhea: NEGATIVE

## 2024-07-04 ENCOUNTER — Ambulatory Visit: Payer: Self-pay | Admitting: Obstetrics and Gynecology

## 2024-07-04 LAB — URINE CULTURE, OB REFLEX

## 2024-07-04 LAB — CULTURE, OB URINE

## 2024-07-04 MED ORDER — NITROFURANTOIN MONOHYD MACRO 100 MG PO CAPS: 100.0000 mg | ORAL_CAPSULE | Freq: Two times a day (BID) | ORAL | 0 refills | Status: AC

## 2024-07-08 ENCOUNTER — Other Ambulatory Visit: Payer: Self-pay

## 2024-07-08 DIAGNOSIS — Z3481 Encounter for supervision of other normal pregnancy, first trimester: Secondary | ICD-10-CM

## 2024-07-08 DIAGNOSIS — Z31430 Encounter of female for testing for genetic disease carrier status for procreative management: Secondary | ICD-10-CM

## 2024-07-08 DIAGNOSIS — Z3A10 10 weeks gestation of pregnancy: Secondary | ICD-10-CM

## 2024-07-09 LAB — COMPREHENSIVE METABOLIC PANEL WITH GFR
ALT: 14 IU/L (ref 0–32)
AST: 17 IU/L (ref 0–40)
Albumin: 4.1 g/dL (ref 4.0–5.0)
Alkaline Phosphatase: 46 IU/L (ref 41–116)
BUN/Creatinine Ratio: 25 — ABNORMAL HIGH (ref 9–23)
BUN: 15 mg/dL (ref 6–20)
Bilirubin Total: 0.3 mg/dL (ref 0.0–1.2)
CO2: 18 mmol/L — ABNORMAL LOW (ref 20–29)
Calcium: 9.2 mg/dL (ref 8.7–10.2)
Chloride: 104 mmol/L (ref 96–106)
Creatinine, Ser: 0.61 mg/dL (ref 0.57–1.00)
Globulin, Total: 2.7 g/dL (ref 1.5–4.5)
Glucose: 109 mg/dL — ABNORMAL HIGH (ref 70–99)
Potassium: 3.9 mmol/L (ref 3.5–5.2)
Sodium: 137 mmol/L (ref 134–144)
Total Protein: 6.8 g/dL (ref 6.0–8.5)
eGFR: 126 mL/min/1.73

## 2024-07-09 LAB — CBC/D/PLT+RPR+RH+ABO+RUBIGG...
Antibody Screen: NEGATIVE
Basophils Absolute: 0 x10E3/uL (ref 0.0–0.2)
Basos: 0 %
EOS (ABSOLUTE): 0.1 x10E3/uL (ref 0.0–0.4)
Eos: 1 %
HCV Ab: NONREACTIVE
HIV Screen 4th Generation wRfx: NONREACTIVE
Hematocrit: 38.7 % (ref 34.0–46.6)
Hemoglobin: 12.9 g/dL (ref 11.1–15.9)
Hepatitis B Surface Ag: NEGATIVE
Immature Grans (Abs): 0 x10E3/uL (ref 0.0–0.1)
Immature Granulocytes: 0 %
Lymphocytes Absolute: 1.7 x10E3/uL (ref 0.7–3.1)
Lymphs: 26 %
MCH: 30.1 pg (ref 26.6–33.0)
MCHC: 33.3 g/dL (ref 31.5–35.7)
MCV: 90 fL (ref 79–97)
Monocytes Absolute: 0.4 x10E3/uL (ref 0.1–0.9)
Monocytes: 5 %
Neutrophils Absolute: 4.5 x10E3/uL (ref 1.4–7.0)
Neutrophils: 68 %
Platelets: 187 x10E3/uL (ref 150–450)
RBC: 4.28 x10E6/uL (ref 3.77–5.28)
RDW: 12.6 % (ref 11.7–15.4)
RPR Ser Ql: NONREACTIVE
Rh Factor: POSITIVE
Rubella Antibodies, IGG: 1.55 {index}
WBC: 6.7 x10E3/uL (ref 3.4–10.8)

## 2024-07-09 LAB — HEMOGLOBIN A1C
Est. average glucose Bld gHb Est-mCnc: 105 mg/dL
Hgb A1c MFr Bld: 5.3 % (ref 4.8–5.6)

## 2024-07-09 LAB — HCV INTERPRETATION

## 2024-07-10 ENCOUNTER — Ambulatory Visit: Payer: Self-pay | Admitting: Obstetrics and Gynecology

## 2024-07-17 LAB — PANORAMA PRENATAL TEST FULL PANEL:PANORAMA TEST PLUS 5 ADDITIONAL MICRODELETIONS: FETAL FRACTION: 5.1

## 2024-07-18 LAB — HORIZON CUSTOM: REPORT SUMMARY: NEGATIVE

## 2024-07-21 ENCOUNTER — Encounter: Payer: Self-pay | Admitting: Obstetrics and Gynecology

## 2024-07-29 ENCOUNTER — Encounter: Admitting: Family Medicine

## 2024-09-08 ENCOUNTER — Other Ambulatory Visit

## 2024-09-08 ENCOUNTER — Ambulatory Visit
